# Patient Record
Sex: Female | Born: 1935 | Race: White | Hispanic: No | Marital: Married | Smoking: Never smoker
Health system: Southern US, Community
[De-identification: ages and names within clinical notes are randomized; demographics above are authoritative.]

---

## 1961-11-03 HISTORY — PX: BREAST BIOPSY: SHX20

## 2016-07-15 DIAGNOSIS — E7800 Pure hypercholesterolemia, unspecified: Secondary | ICD-10-CM | POA: Insufficient documentation

## 2016-09-30 ENCOUNTER — Other Ambulatory Visit: Payer: Self-pay | Admitting: Family Medicine

## 2016-09-30 DIAGNOSIS — Z1231 Encounter for screening mammogram for malignant neoplasm of breast: Secondary | ICD-10-CM

## 2016-11-06 ENCOUNTER — Other Ambulatory Visit: Payer: Self-pay | Admitting: Family Medicine

## 2016-11-06 ENCOUNTER — Ambulatory Visit
Admission: RE | Admit: 2016-11-06 | Discharge: 2016-11-06 | Disposition: A | Discharge status: Home / Self Care | Payer: Medicare Other | Source: Ambulatory Visit | Attending: Family Medicine | Admitting: Family Medicine

## 2016-11-06 DIAGNOSIS — Z1231 Encounter for screening mammogram for malignant neoplasm of breast: Secondary | ICD-10-CM

## 2016-11-14 ENCOUNTER — Other Ambulatory Visit: Payer: Self-pay | Admitting: *Deleted

## 2016-11-14 ENCOUNTER — Inpatient Hospital Stay
Admission: RE | Admit: 2016-11-14 | Discharge: 2016-11-14 | Disposition: A | Discharge status: Home / Self Care | Payer: Self-pay | Source: Ambulatory Visit | Attending: *Deleted | Admitting: *Deleted

## 2016-11-14 DIAGNOSIS — Z9289 Personal history of other medical treatment: Secondary | ICD-10-CM

## 2017-01-15 DIAGNOSIS — L719 Rosacea, unspecified: Secondary | ICD-10-CM | POA: Insufficient documentation

## 2017-01-15 DIAGNOSIS — E559 Vitamin D deficiency, unspecified: Secondary | ICD-10-CM | POA: Insufficient documentation

## 2017-10-09 ENCOUNTER — Other Ambulatory Visit: Payer: Self-pay | Admitting: Family Medicine

## 2017-10-09 DIAGNOSIS — Z1231 Encounter for screening mammogram for malignant neoplasm of breast: Secondary | ICD-10-CM

## 2017-11-11 ENCOUNTER — Ambulatory Visit
Admission: RE | Admit: 2017-11-11 | Discharge: 2017-11-11 | Disposition: A | Discharge status: Home / Self Care | Payer: Medicare Other | Source: Ambulatory Visit | Attending: Family Medicine | Admitting: Family Medicine

## 2017-11-11 DIAGNOSIS — Z1231 Encounter for screening mammogram for malignant neoplasm of breast: Secondary | ICD-10-CM | POA: Diagnosis present

## 2018-02-10 IMAGING — MG MM DIGITAL SCREENING BILAT W/ TOMO W/ CAD
8 of 12 series · 8 of 28 positions shown · non-contrast
Comparison: Previous exam(s).

CLINICAL DATA: Screening.

EXAM:
2D DIGITAL SCREENING BILATERAL MAMMOGRAM WITH CAD AND ADJUNCT TOMO

[L CC]
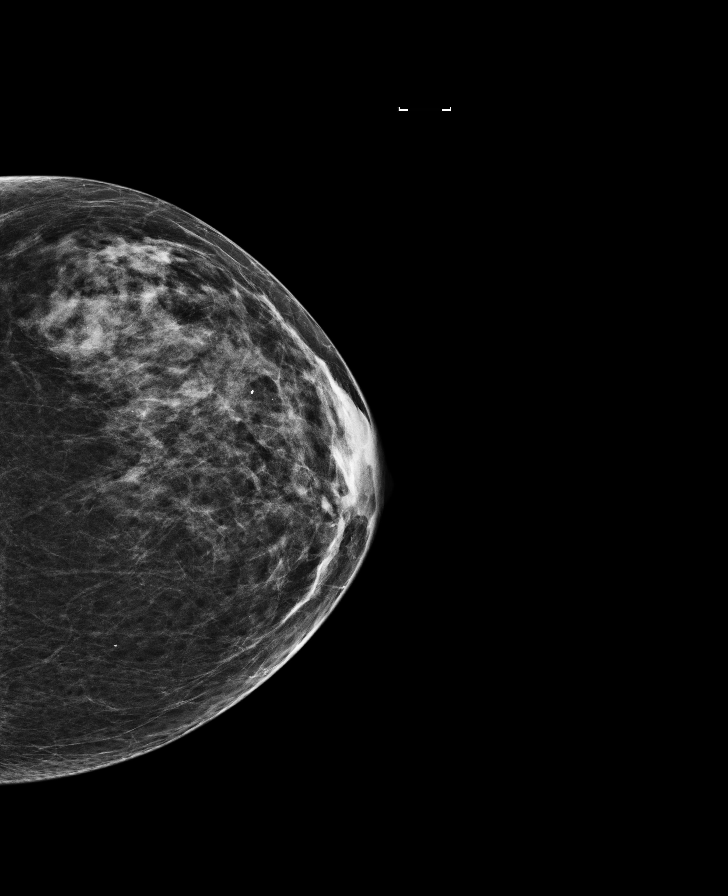

[L MLO]
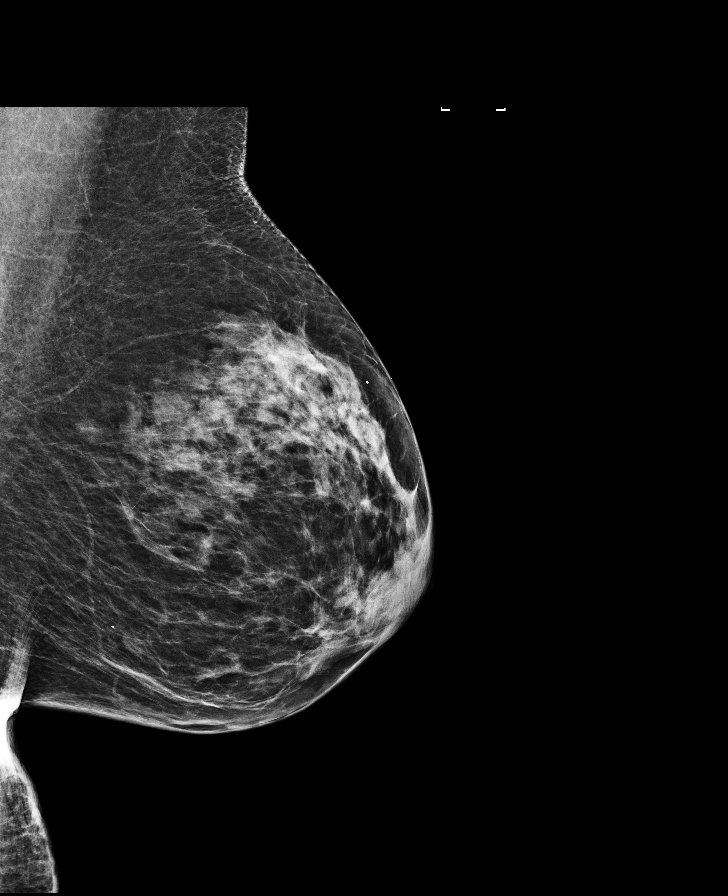

[L CC synth-2D]
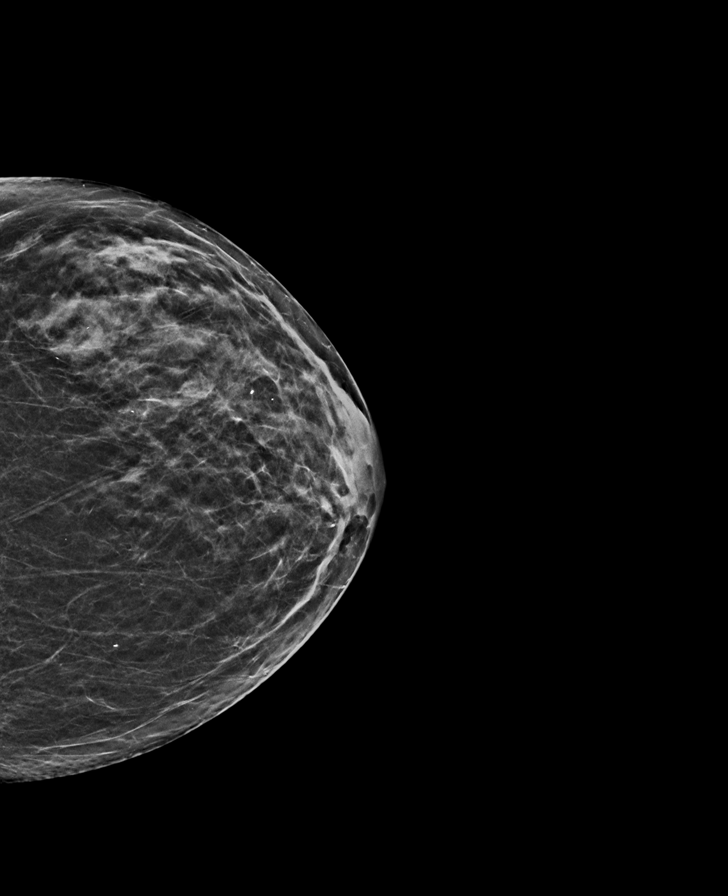

[R MLO synth-2D]
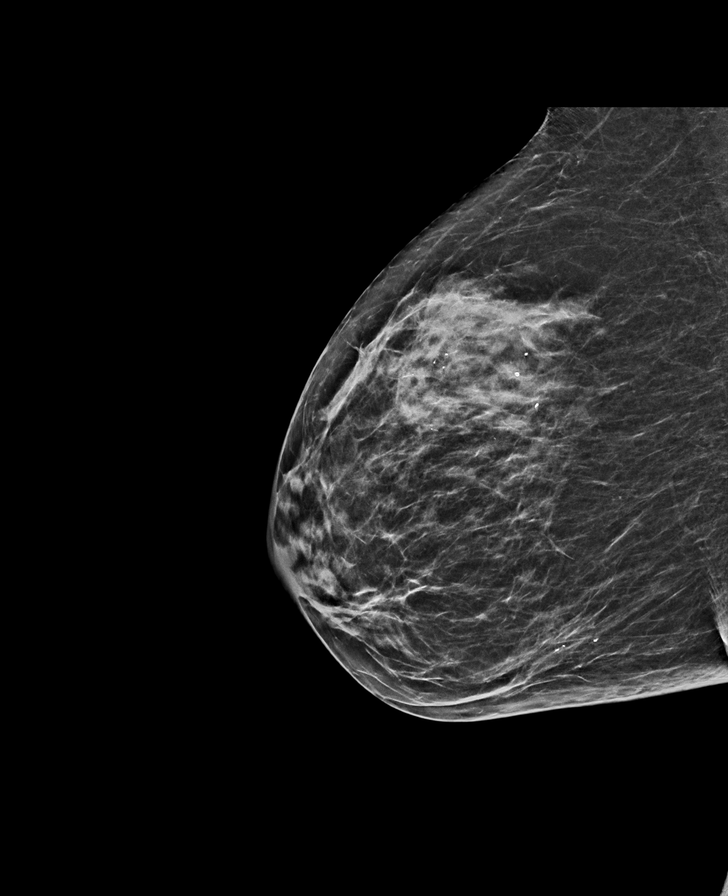

[R CC synth-2D]
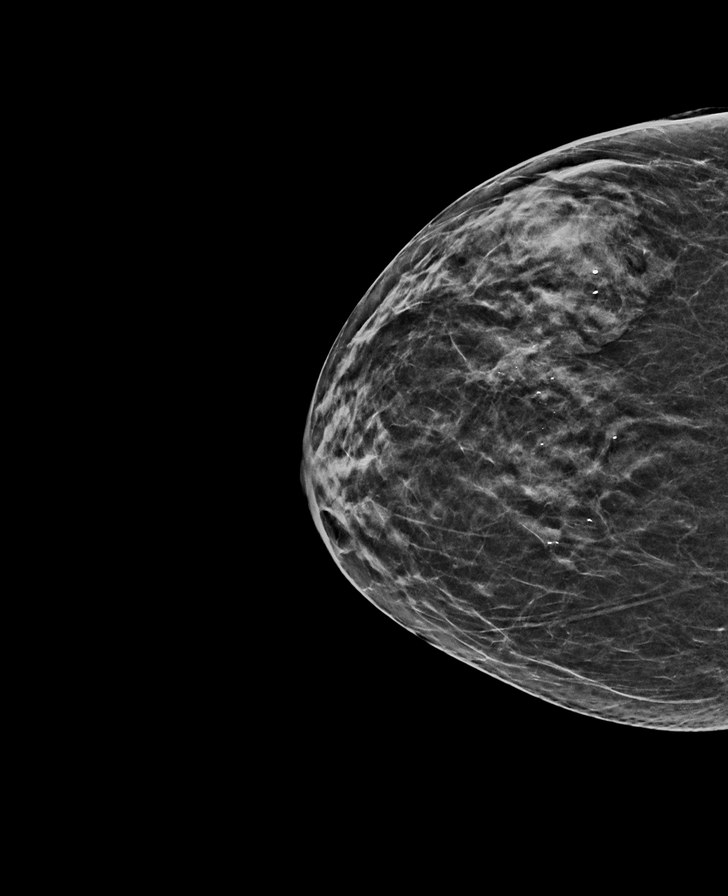

[L MLO synth-2D]
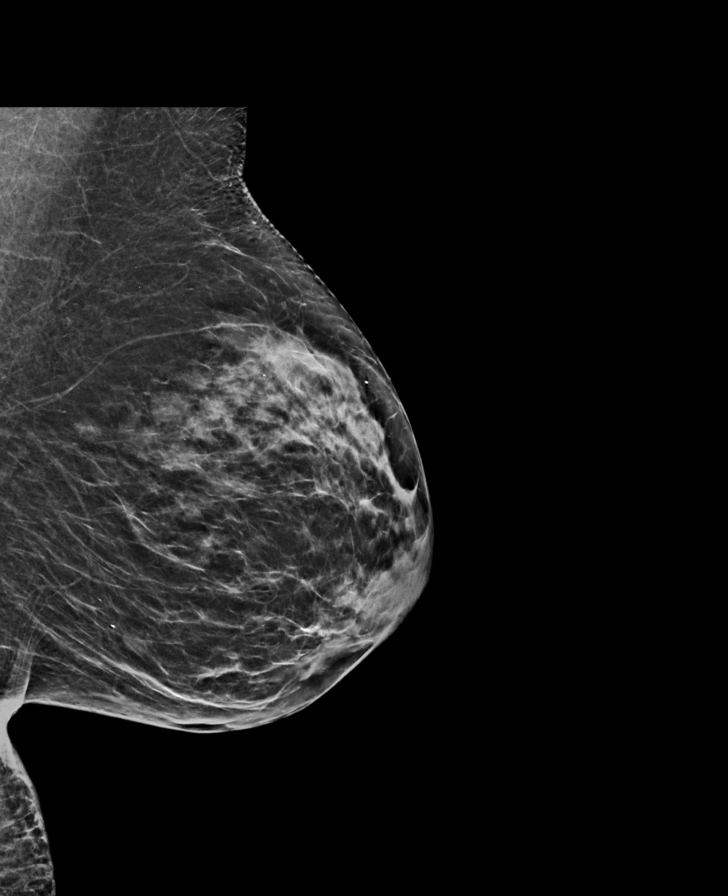

[R CC]
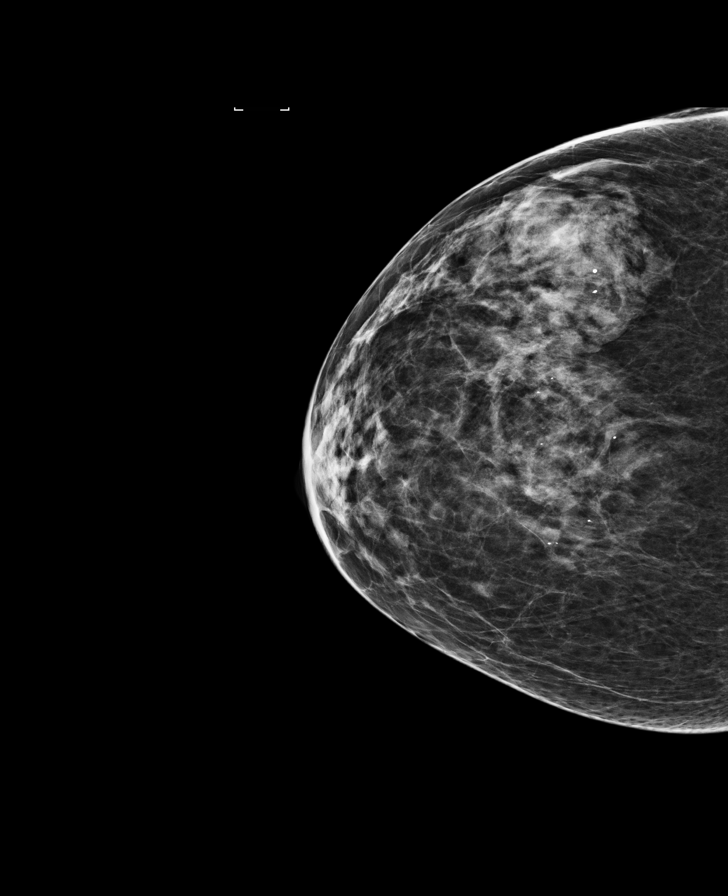

[R MLO]
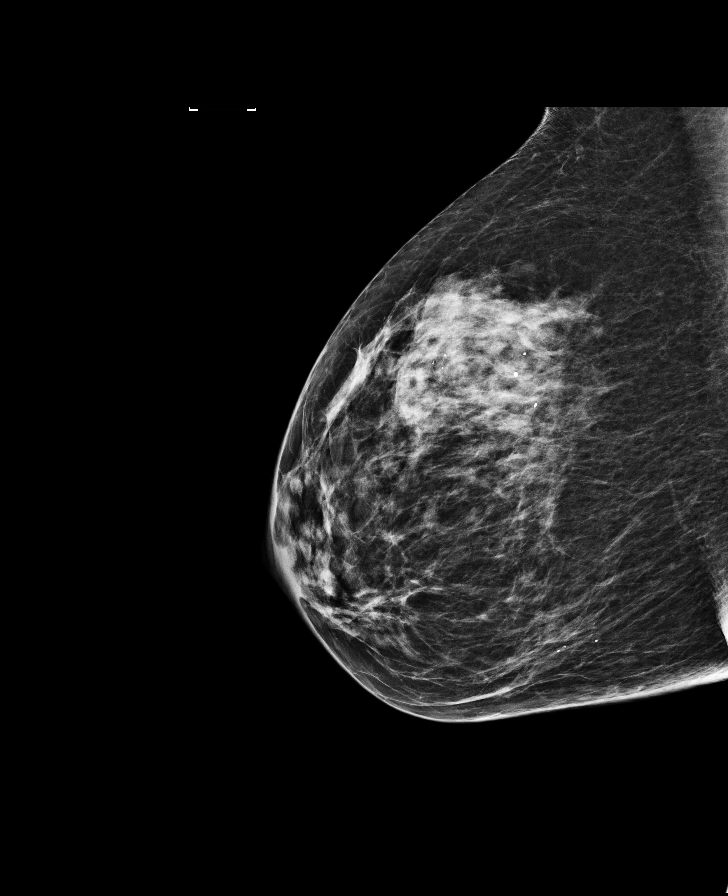

[8 of 28 positions shown; findings below may reference images not displayed]

ACR Breast Density Category c: The breast tissue is heterogeneously
dense, which may obscure small masses.
FINDINGS: There are no findings suspicious for malignancy. Images were
processed with CAD.
IMPRESSION: No mammographic evidence of malignancy. A result letter of this
screening mammogram will be mailed directly to the patient.

RECOMMENDATION:
Screening mammogram in one year. (Code:[id number])

BI-RADS CATEGORY  1: Negative.

## 2018-10-08 ENCOUNTER — Other Ambulatory Visit: Payer: Self-pay | Admitting: Family Medicine

## 2018-10-08 DIAGNOSIS — Z1231 Encounter for screening mammogram for malignant neoplasm of breast: Secondary | ICD-10-CM

## 2018-11-17 ENCOUNTER — Ambulatory Visit
Admission: RE | Admit: 2018-11-17 | Discharge: 2018-11-17 | Disposition: A | Discharge status: Home / Self Care | Payer: Medicare Other | Source: Ambulatory Visit | Attending: Family Medicine | Admitting: Family Medicine

## 2018-11-17 DIAGNOSIS — Z1231 Encounter for screening mammogram for malignant neoplasm of breast: Secondary | ICD-10-CM | POA: Insufficient documentation

## 2019-07-29 DIAGNOSIS — L659 Nonscarring hair loss, unspecified: Secondary | ICD-10-CM | POA: Insufficient documentation

## 2019-10-17 ENCOUNTER — Other Ambulatory Visit: Payer: Self-pay | Admitting: Family Medicine

## 2019-10-17 DIAGNOSIS — Z1231 Encounter for screening mammogram for malignant neoplasm of breast: Secondary | ICD-10-CM

## 2019-11-25 ENCOUNTER — Ambulatory Visit
Admission: RE | Admit: 2019-11-25 | Discharge: 2019-11-25 | Disposition: A | Discharge status: Home / Self Care | Payer: Medicare PPO | Source: Ambulatory Visit | Attending: Family Medicine | Admitting: Family Medicine

## 2019-11-25 DIAGNOSIS — Z1231 Encounter for screening mammogram for malignant neoplasm of breast: Secondary | ICD-10-CM | POA: Insufficient documentation

## 2020-11-12 ENCOUNTER — Other Ambulatory Visit: Payer: Self-pay | Admitting: Family Medicine

## 2020-11-12 DIAGNOSIS — Z1231 Encounter for screening mammogram for malignant neoplasm of breast: Secondary | ICD-10-CM

## 2020-12-07 ENCOUNTER — Other Ambulatory Visit: Payer: Self-pay

## 2020-12-07 ENCOUNTER — Ambulatory Visit
Admission: RE | Admit: 2020-12-07 | Discharge: 2020-12-07 | Disposition: A | Discharge status: Home / Self Care | Payer: Medicare PPO | Source: Ambulatory Visit | Attending: Family Medicine | Admitting: Family Medicine

## 2020-12-07 DIAGNOSIS — Z1231 Encounter for screening mammogram for malignant neoplasm of breast: Secondary | ICD-10-CM | POA: Diagnosis present

## 2021-11-19 ENCOUNTER — Other Ambulatory Visit: Payer: Self-pay | Admitting: Family Medicine

## 2021-11-19 DIAGNOSIS — Z1231 Encounter for screening mammogram for malignant neoplasm of breast: Secondary | ICD-10-CM

## 2021-12-27 ENCOUNTER — Ambulatory Visit
Admission: RE | Admit: 2021-12-27 | Discharge: 2021-12-27 | Disposition: A | Discharge status: Home / Self Care | Payer: Medicare PPO | Source: Ambulatory Visit | Attending: Family Medicine | Admitting: Family Medicine

## 2021-12-27 ENCOUNTER — Other Ambulatory Visit: Payer: Self-pay

## 2021-12-27 DIAGNOSIS — Z1231 Encounter for screening mammogram for malignant neoplasm of breast: Secondary | ICD-10-CM | POA: Diagnosis present

## 2022-09-02 DIAGNOSIS — R7303 Prediabetes: Secondary | ICD-10-CM | POA: Insufficient documentation

## 2022-11-24 ENCOUNTER — Encounter: Payer: Self-pay | Admitting: Family Medicine

## 2022-11-24 DIAGNOSIS — Z1231 Encounter for screening mammogram for malignant neoplasm of breast: Secondary | ICD-10-CM

## 2022-11-25 ENCOUNTER — Other Ambulatory Visit: Payer: Self-pay | Admitting: Family Medicine

## 2022-11-25 DIAGNOSIS — Z1231 Encounter for screening mammogram for malignant neoplasm of breast: Secondary | ICD-10-CM

## 2023-01-21 ENCOUNTER — Ambulatory Visit
Admission: RE | Admit: 2023-01-21 | Discharge: 2023-01-21 | Disposition: A | Discharge status: Home / Self Care | Payer: Medicare PPO | Source: Ambulatory Visit | Attending: Family Medicine | Admitting: Family Medicine

## 2023-01-21 DIAGNOSIS — Z1231 Encounter for screening mammogram for malignant neoplasm of breast: Secondary | ICD-10-CM | POA: Insufficient documentation

## 2023-05-27 ENCOUNTER — Ambulatory Visit: Payer: Medicare PPO | Admitting: Student

## 2023-05-27 ENCOUNTER — Encounter: Payer: Self-pay | Admitting: Student

## 2023-05-27 VITALS — BP 134/76 | HR 76 | Temp 98.2°F | Ht 60.0 in | Wt 115.0 lb

## 2023-05-27 DIAGNOSIS — L0100 Impetigo, unspecified: Secondary | ICD-10-CM | POA: Diagnosis not present

## 2023-05-27 DIAGNOSIS — B880 Other acariasis: Secondary | ICD-10-CM

## 2023-05-27 DIAGNOSIS — M810 Age-related osteoporosis without current pathological fracture: Secondary | ICD-10-CM

## 2023-05-27 MED ORDER — TRIAMCINOLONE ACETONIDE 0.1 % EX CREA
1.0000 | TOPICAL_CREAM | Freq: Two times a day (BID) | CUTANEOUS | 0 refills | Status: AC
Start: 2023-05-27 — End: ?

## 2023-05-27 MED ORDER — RISEDRONATE SODIUM 150 MG PO TABS
150.0000 mg | ORAL_TABLET | ORAL | 3 refills | Status: DC
Start: 2023-05-27 — End: 2024-01-12

## 2023-05-27 MED ORDER — MUPIROCIN 2 % EX OINT
1.0000 | TOPICAL_OINTMENT | Freq: Two times a day (BID) | CUTANEOUS | 0 refills | Status: DC
Start: 2023-05-27 — End: 2024-09-27

## 2023-05-27 NOTE — Patient Instructions (Addendum)
For the areas that are itching on your legs, please apply a small amount of Triamcinolone (Kenalog) cream and Mupirocin (Bactroban) ointment to all of the red areas on your legs.   Please purchase OFF Bug spray to wear before you go on walks, cross through a treed space.

## 2023-05-27 NOTE — Progress Notes (Signed)
[facility] clinic [facility].   Provider: Dr. Earnestine Mealing  Code Status: Full Code Goals of Care:     05/27/2023    2:49 PM  Advanced Directives  Does Patient Have a Medical Advance Directive? Yes  Type of Advance Directive Healthcare Power of Attorney  Does patient want to make changes to medical advance directive? No - Patient declined  Copy of Healthcare Power of Attorney in Chart? No - copy requested     Chief Complaint  Patient presents with   Acute Visit    Rash    HPI: Patient is a 89 y.o. female seen today for an acute visit for Rash  She has a new rash. Unclear  if it's from a bit. She noticed the first bite on Sunday. And Now it's on multiple locations. She had little yellow pustules on top of them. She took a sewing needle and dipped it in alcohol and she picked off the pus. She spray antiseptic on it as well.    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   She wants to stop taking ibandronate-- she has been taking it for 4 months. Doesn't like it and wants it changed.   History reviewed. No pertinent past medical history.  Past Surgical History:  Procedure Laterality Date   BREAST BIOPSY Right 1963   negative    Allergies  Allergen Reactions   Bee Pollen    Molds & Smuts     Outpatient Encounter Medications as of 05/27/2023  Medication Sig   acetaminophen (TYLENOL) 500 MG tablet Take 500 mg by mouth every 6 (six) hours as needed.   Biotin 5000 MCG CAPS Take 1 capsule by mouth daily.   cetirizine (ZYRTEC) 10 MG tablet Take 10 mg by mouth daily.   Cholecalciferol (VITAMIN D3) 50 MCG (2000 UT) capsule Take 2,000 Units by mouth daily.   ezetimibe (ZETIA) 10 MG tablet Take 10 mg by mouth daily.   metroNIDAZOLE (METROCREAM) 0.75 % cream Apply 1 Application topically 2 (two) times daily.   mupirocin ointment (BACTROBAN) 2 % Apply 1 Application topically 2 (two) times daily.   risedronate (ACTONEL) 150 MG tablet Take 1 tablet (150 mg total) by  mouth every 30 (thirty) days. with water on empty stomach, nothing by mouth or lie down for next 30 minutes.   triamcinolone cream (KENALOG) 0.1 % Apply 1 Application topically 2 (two) times daily.   [DISCONTINUED] ibandronate (BONIVA) 150 MG tablet Take 150 mg by mouth every 30 (thirty) days. Take in the morning with a full glass of water, on an empty stomach, and do not take anything else by mouth or lie down for the next 30 min.   No facility-administered encounter medications on file as of 05/27/2023.    Review of Systems:  Review of Systems  Health Maintenance  Topic Date Due   Medicare Annual Wellness (AWV)  Never done   Zoster Vaccines- Shingrix (1 of 2) 09/20/1951   DEXA SCAN  Never done   DTaP/Tdap/Td (2 - Td or Tdap) 07/04/2020   COVID-19 Vaccine (9 - 2023-24 season) 04/07/2023   INFLUENZA VACCINE  06/04/2023   Pneumonia Vaccine 57+ Years old  Completed   HPV VACCINES  Aged Out    Physical Exam: Vitals:   05/27/23 1440  BP: 134/76  Pulse: 76  Temp: 98.2 F (36.8 C)  SpO2: 96%  Weight: 115 lb (52.2 kg)  Height: 5' (1.524 m)   Body mass index is 22.46 kg/m. Physical  Exam Constitutional:      Appearance: Normal appearance.  Skin:    Comments: Numerous erythematous pustules of the ankles and legs.   Neurological:     Mental Status: She is alert and oriented to person, place, and time.  Psychiatric:        Mood and Affect: Mood normal.     Labs reviewed: Basic Metabolic Panel: No results for input(s): "NA", "K", "CL", "CO2", "GLUCOSE", "BUN", "CREATININE", "CALCIUM", "MG", "PHOS", "TSH" in the last 8760 hours. Liver Function Tests: No results for input(s): "AST", "ALT", "ALKPHOS", "BILITOT", "PROT", "ALBUMIN" in the last 8760 hours. No results for input(s): "LIPASE", "AMYLASE" in the last 8760 hours. No results for input(s): "AMMONIA" in the last 8760 hours. CBC: No results for input(s): "WBC", "NEUTROABS", "HGB", "HCT", "MCV", "PLT" in the last 8760  hours. Lipid Panel: No results for input(s): "CHOL", "HDL", "LDLCALC", "TRIG", "CHOLHDL", "LDLDIRECT" in the last 8760 hours. No results found for: "HGBA1C"  Procedures since last visit: No results found.  Assessment/Plan Age-related osteoporosis without current pathological fracture - Plan: risedronate (ACTONEL) 150 MG tablet  Chiggers - Plan: mupirocin ointment (BACTROBAN) 2 %, triamcinolone cream (KENALOG) 0.1 %  Impetigo Patient with chigger infestation. Encouraged bug spray and washing clothes with hot water. Cream for itching and overlying impetigo. Antipruitic and antiseptic prescribed.   Labs/tests ordered:  * No order type specified * Next appt:  Visit date not found   I spent greater than 30 minutes for the care of this patient in face to face time, chart review, clinical documentation, patient education.

## 2023-06-05 ENCOUNTER — Ambulatory Visit: Payer: Medicare PPO | Admitting: Student

## 2023-06-05 ENCOUNTER — Encounter: Payer: Self-pay | Admitting: Student

## 2023-06-05 VITALS — BP 124/62 | HR 69 | Temp 97.8°F | Ht 60.0 in | Wt 116.0 lb

## 2023-06-05 DIAGNOSIS — Z23 Encounter for immunization: Secondary | ICD-10-CM

## 2023-06-05 DIAGNOSIS — M810 Age-related osteoporosis without current pathological fracture: Secondary | ICD-10-CM | POA: Diagnosis not present

## 2023-06-05 DIAGNOSIS — E559 Vitamin D deficiency, unspecified: Secondary | ICD-10-CM

## 2023-06-05 DIAGNOSIS — E7800 Pure hypercholesterolemia, unspecified: Secondary | ICD-10-CM

## 2023-06-05 DIAGNOSIS — R7303 Prediabetes: Secondary | ICD-10-CM

## 2023-06-05 NOTE — Progress Notes (Signed)
[facility] clinic [facility].   Provider: Dr. Earnestine Mealing  Code Status: Full Code Goals of Care:     06/05/2023    2:15 PM  Advanced Directives  Does Patient Have a Medical Advance Directive? Yes  Type of Advance Directive Healthcare Power of Attorney  Does patient want to make changes to medical advance directive? No - Patient declined  Copy of Healthcare Power of Attorney in Chart? No - copy requested     Chief Complaint  Patient presents with   Establish Care    NP to establish Care.    Quality Metric Gaps    To discuss need for Zoster, Tdap, Covid, Dexascan and AWV   HPI: Patient is a 89 y.o. female seen today To establish.   The Chigger bites "still look stupid" but they are healing.   Medical History/ Medications: No concerns f/u A1c q36mo next due 08/2023 Osteoporosis - will start Alopecia - she sees dermatology.  Prediabetes 5.9 02/2023.  Rosacea - metro cream BID.   Mobility - she had a fall in a [employer] parking lot. Right hand x-rayed. She is more careful. Curb was the issue on that one. She drives. No assistive devices. Enjoys walks when the weather is nice.   Mentation - mood and memory Husband passed. She is glad he isn't suffering anymore. Her husband just passed away. She is eating well and sleeping well. Missing going to lunch with him.   Matters - she wants to avoid diabetes.She often follows the sugar busters diet.   Her daughter is in town. She has to think about what she wants to do. She has 24 units on her court and people have invited her to lunch. She is trying to figure out what she would like to do. Retirement devotions? They have donated everything to [employer]. 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 She moved to [place] and she went to [employer] in [place]. She worked with pre-schoolers. Two daughters - one in [place] and the other is in [place]. She has one grandson Lawrence in [place].    Eyes and ears checked semiannually.   History reviewed. No pertinent past medical history.  Past Surgical History:  Procedure Laterality Date   BREAST BIOPSY Right 1963   negative    Allergies  Allergen Reactions   Bee Pollen    Molds & Smuts     Outpatient Encounter Medications as of 06/05/2023  Medication Sig   acetaminophen (TYLENOL) 500 MG tablet Take 500 mg by mouth every 6 (six) hours as needed.   Biotin 5000 MCG CAPS Take 1 capsule by mouth daily.   cetirizine (ZYRTEC) 10 MG tablet Take 10 mg by mouth daily.   Cholecalciferol (VITAMIN D3) 50 MCG (2000 UT) capsule Take 2,000 Units by mouth daily.   ezetimibe (ZETIA) 10 MG tablet Take 10 mg by mouth daily.   metroNIDAZOLE (METROCREAM) 0.75 % cream Apply 1 Application topically 2 (two) times daily.   mupirocin ointment (BACTROBAN) 2 % Apply 1 Application topically 2 (two) times daily.   risedronate (ACTONEL) 150 MG tablet Take 1 tablet (150 mg total) by mouth every 30 (thirty) days. with water on empty stomach, nothing by mouth or lie down for next 30 minutes.   triamcinolone cream (KENALOG) 0.1 % Apply 1 Application topically 2 (two) times daily.   No facility-administered encounter medications on file as of 06/05/2023.    Review of Systems:  Review of  Systems  Health Maintenance  Topic Date Due   Medicare Annual Wellness (AWV)  Never done   Zoster Vaccines- Shingrix (1 of 2) 09/20/1951   DTaP/Tdap/Td (2 - Td or Tdap) 07/04/2020   COVID-19 Vaccine (9 - 2023-24 season) 04/07/2023   INFLUENZA VACCINE  06/04/2023   Pneumonia Vaccine 55+ Years old  Completed   DEXA SCAN  Completed   HPV VACCINES  Aged Out    Physical Exam: Vitals:   06/05/23 1412  BP: 124/62  Pulse: 69  Temp: 97.8 F (36.6 C)  SpO2: 96%  Weight: 116 lb (52.6 kg)  Height: 5' (1.524 m)   Body mass index is 22.65 kg/m. Physical Exam Vitals reviewed.  Constitutional:      Appearance: Normal appearance. She is normal weight.  Cardiovascular:      Rate and Rhythm: Normal rate and regular rhythm.     Pulses: Normal pulses.  Pulmonary:     Effort: Pulmonary effort is normal.  Abdominal:     General: Abdomen is flat.     Palpations: Abdomen is soft.  Skin:    General: Skin is warm and dry.  Neurological:     Mental Status: She is alert and oriented to person, place, and time.     Labs reviewed: Basic Metabolic Panel: No results for input(s): "NA", "K", "CL", "CO2", "GLUCOSE", "BUN", "CREATININE", "CALCIUM", "MG", "PHOS", "TSH" in the last 8760 hours. Liver Function Tests: No results for input(s): "AST", "ALT", "ALKPHOS", "BILITOT", "PROT", "ALBUMIN" in the last 8760 hours. No results for input(s): "LIPASE", "AMYLASE" in the last 8760 hours. No results for input(s): "AMMONIA" in the last 8760 hours. CBC: No results for input(s): "WBC", "NEUTROABS", "HGB", "HCT", "MCV", "PLT" in the last 8760 hours. Lipid Panel: No results for input(s): "CHOL", "HDL", "LDLCALC", "TRIG", "CHOLHDL", "LDLDIRECT" in the last 8760 hours. No results found for: "HGBA1C"  Procedures since last visit: No results found.  Assessment/Plan Need for zoster vaccination - Plan: Zoster Recombinant (Shingrix )  Need for tetanus booster - Plan: DTaP HepB IPV combined vaccine IM  Prediabetes - Plan: Hemoglobin A1c  Age-related osteoporosis without current pathological fracture  Vitamin D deficiency - Plan: VITAMIN D 25 Hydroxy (Vit-D Deficiency, Fractures)  Pure hypercholesterolemia - Plan: CBC With Differential/Platelet, Lipid Panel, Complete Metabolic Panel with eGFR, CANCELED: Basic Metabolic Panel with eGFR Patient has been stable with most recent labs in normal range. F/u 2 months for labs as well as follow up on medication change to risedronate and her mood.    Labs/tests ordered:  * No order type specified * Next appt:  Visit date not found   I spent greater than 60 minutes for the care of this patient in face to face time, chart review,  clinical documentation, patient education.

## 2023-06-05 NOTE — Patient Instructions (Addendum)
Please go to [facility] ot get your tetanus and shingles vaccination.   Please come to clinic on October 3 at 7:30 AM to have your labs collected. I will see you the following Monday.   Please bring your health care power of attorney paperwork to your next appointment or before.   Pain - acetaminophen (TYLENOL) 500 MG tablet Hair Vitamin - Biotin 5000 MCG CAPS  Vitamin D Deficiency/Osteoporosis (Bone Health) - Cholecalciferol (VITAMIN D3) 50 MCG (2000 UT) capsule Osteoporosis (Bone Health) risedronate (ACTONEL) 150 MG tablet  Cholesterol - ezetimibe (ZETIA) 10 MG tablet Rosacea (skin on nose) - metroNIDAZOLE (METROCREAM) 0.75 % cream Chiggers - mupirocin ointment (BACTROBAN) 2 % triamcinolone cream (KENALOG) 0.1 % Allergy Medication - Cetirizine 10 mg (Zyrtec)

## 2023-06-08 ENCOUNTER — Telehealth: Payer: Self-pay | Admitting: *Deleted

## 2023-06-08 DIAGNOSIS — Z23 Encounter for immunization: Secondary | ICD-10-CM

## 2023-06-08 MED ORDER — TETANUS-DIPHTH-ACELL PERTUSSIS 5-2-15.5 LF-MCG/0.5 IM SUSP
0.5000 mL | Freq: Once | INTRAMUSCULAR | 0 refills | Status: AC
Start: 2023-06-08 — End: 2023-06-08

## 2023-06-08 NOTE — Telephone Encounter (Signed)
Patient called and stated that Dr. Sydnee Cabal recommended patient to get a Tdap booster. Pharmacy will not give to patient without a Rx.   Pended Rx and sent to Dr. Sydnee Cabal for approval.

## 2023-06-08 NOTE — Telephone Encounter (Signed)
Rx sent to [facility]

## 2023-06-09 NOTE — Telephone Encounter (Signed)
Patient notified and agreed.  

## 2023-08-07 LAB — LIPID PANEL
Cholesterol: 143 mg/dL (ref <200)
HDL: 56 mg/dL (ref >=50)
LDL Cholesterol (Calc): 72 mg/dL
Non-HDL Cholesterol (Calc): 87 mg/dL (ref <130)
Total CHOL/HDL Ratio: 2.6 (calc) (ref <5.0)
Triglycerides: 69 mg/dL (ref <150)

## 2023-08-07 LAB — CBC WITH DIFFERENTIAL/PLATELET
Absolute Monocytes: 548 {cells}/uL (ref 200–950)
Basophils Absolute: 38 {cells}/uL (ref 0–200)
Basophils Relative: 0.6 %
Eosinophils Absolute: 347 {cells}/uL (ref 15–500)
Eosinophils Relative: 5.5 %
HCT: 40 % (ref 35.0–45.0)
Hemoglobin: 13.2 g/dL (ref 11.7–15.5)
Lymphs Abs: 2413 {cells}/uL (ref 850–3900)
MCH: 34.5 pg — ABNORMAL HIGH (ref 27.0–33.0)
MCHC: 33 g/dL (ref 32.0–36.0)
MCV: 104.4 fL — ABNORMAL HIGH (ref 80.0–100.0)
MPV: 10.4 fL (ref 7.5–12.5)
Monocytes Relative: 8.7 %
Neutro Abs: 2955 {cells}/uL (ref 1500–7800)
Neutrophils Relative %: 46.9 %
Platelets: 242 10*3/uL (ref 140–400)
RBC: 3.83 10*6/uL (ref 3.80–5.10)
RDW: 12.4 % (ref 11.0–15.0)
Total Lymphocyte: 38.3 %
WBC: 6.3 10*3/uL (ref 3.8–10.8)

## 2023-08-07 LAB — COMPLETE METABOLIC PANEL WITH GFR
AG Ratio: 1.7 (calc) (ref 1.0–2.5)
ALT: 13 U/L (ref 6–29)
AST: 22 U/L (ref 10–35)
Albumin: 4.2 g/dL (ref 3.6–5.1)
Alkaline phosphatase (APISO): 62 U/L (ref 37–153)
BUN: 17 mg/dL (ref 7–25)
CO2: 29 mmol/L (ref 20–32)
Calcium: 8.7 mg/dL (ref 8.6–10.4)
Chloride: 106 mmol/L (ref 98–110)
Creat: 0.82 mg/dL (ref 0.60–0.95)
Globulin: 2.5 g/dL (ref 1.9–3.7)
Glucose, Bld: 65 mg/dL (ref 65–99)
Potassium: 3.9 mmol/L (ref 3.5–5.3)
Sodium: 143 mmol/L (ref 135–146)
Total Bilirubin: 0.5 mg/dL (ref 0.2–1.2)
Total Protein: 6.7 g/dL (ref 6.1–8.1)
eGFR: 69 mL/min/{1.73_m2} (ref >=60)

## 2023-08-07 LAB — HEMOGLOBIN A1C
Hgb A1c MFr Bld: 5.9 %{Hb} — ABNORMAL HIGH (ref <5.7)
Mean Plasma Glucose: 123 mg/dL
eAG (mmol/L): 6.8 mmol/L

## 2023-08-07 LAB — VITAMIN D 25 HYDROXY (VIT D DEFICIENCY, FRACTURES): Vit D, 25-Hydroxy: 46 ng/mL (ref 30–100)

## 2023-08-10 ENCOUNTER — Ambulatory Visit: Payer: Medicare PPO | Admitting: Student

## 2023-08-12 ENCOUNTER — Encounter: Payer: Self-pay | Admitting: Student

## 2023-08-12 ENCOUNTER — Ambulatory Visit: Payer: Medicare PPO | Admitting: Student

## 2023-08-12 VITALS — BP 118/64 | HR 67 | Temp 98.0°F | Ht 60.0 in | Wt 113.0 lb

## 2023-08-12 DIAGNOSIS — E7800 Pure hypercholesterolemia, unspecified: Secondary | ICD-10-CM

## 2023-08-12 DIAGNOSIS — M810 Age-related osteoporosis without current pathological fracture: Secondary | ICD-10-CM | POA: Diagnosis not present

## 2023-08-12 DIAGNOSIS — F4321 Adjustment disorder with depressed mood: Secondary | ICD-10-CM

## 2023-08-12 DIAGNOSIS — R7303 Prediabetes: Secondary | ICD-10-CM

## 2023-08-12 DIAGNOSIS — E559 Vitamin D deficiency, unspecified: Secondary | ICD-10-CM | POA: Diagnosis not present

## 2023-08-12 NOTE — Progress Notes (Unsigned)
Location:  [facility] clinic [facility].   Provider: Dr. Earnestine Mealing  Code Status: Full Code Goals of Care:     08/12/2023    1:07 PM  Advanced Directives  Does Patient Have a Medical Advance Directive? Yes  Type of Advance Directive Healthcare Power of Attorney  Does patient want to make changes to medical advance directive? No - Patient declined  Copy of Healthcare Power of Attorney in Chart? Yes - validated most recent copy scanned in chart (See row information)     Chief Complaint  Patient presents with  . Medical Management of Chronic Issues    Medical Management of Chronic Issues. 2 Month follow up with labs.     HPI: Patient is a 89 y.o. female seen today for medical management of chronic diseases.    Discussed in detail patients labs 12 minute discussion  Mood - has been up and down. She is going to have to get used to going to the hearth on campus without him being there meeting him for lunch. She has trouble going to the hearth without seeing her husband. He has been declining for 2 years.  She has been mourning the loss of her husband Jonny Ruiz who died in 06/03/23 of this year.  John's birthday was in 03-Jun-2023 as well as their anniversary and ultimately his passing.  03-Jun-2023 was a really hard month for them.  She continues to have sadness but things are getting better.  She is hopeful.  She started the new medication on a day that she remembers what day it is. She took the first bone pill on 9/18 no major side effects.   History reviewed. No pertinent past medical history.  Past Surgical History:  Procedure Laterality Date  . BREAST BIOPSY Right 1963   negative    Allergies  Allergen Reactions  . Bee Pollen   . Molds & Smuts     Outpatient Encounter Medications as of 08/12/2023  Medication Sig  . acetaminophen (TYLENOL) 500 MG tablet Take 500 mg by mouth every 6 (six) hours as needed.  . Biotin 5000 MCG CAPS Take 1 capsule by mouth daily.  . cetirizine (ZYRTEC) 10 MG  tablet Take 10 mg by mouth daily.  . Cholecalciferol (VITAMIN D3) 50 MCG (2000 UT) capsule Take 2,000 Units by mouth daily.  Marland Kitchen ezetimibe (ZETIA) 10 MG tablet Take 10 mg by mouth daily.  . metroNIDAZOLE (METROCREAM) 0.75 % cream Apply 1 Application topically 2 (two) times daily.  . mupirocin ointment (BACTROBAN) 2 % Apply 1 Application topically 2 (two) times daily.  . risedronate (ACTONEL) 150 MG tablet Take 1 tablet (150 mg total) by mouth every 30 (thirty) days. with water on empty stomach, nothing by mouth or lie down for next 30 minutes.  . triamcinolone cream (KENALOG) 0.1 % Apply 1 Application topically 2 (two) times daily.   No facility-administered encounter medications on file as of 08/12/2023.    Review of Systems:  Review of Systems  Health Maintenance  Topic Date Due  . Medicare Annual Wellness (AWV)  Never done  . INFLUENZA VACCINE  06/04/2023  . COVID-19 Vaccine (10 - 2023-24 season) 09/25/2023  . DTaP/Tdap/Td (3 - Td or Tdap) 06/09/2033  . Pneumonia Vaccine 27+ Years old  Completed  . DEXA SCAN  Completed  . Zoster Vaccines- Shingrix  Completed  . HPV VACCINES  Aged Out    Physical Exam: Vitals:   08/12/23 1301  BP: 118/64  Pulse: 67  Temp: 98  F (36.7 C)  SpO2: 95%  Weight: 113 lb (51.3 kg)  Height: 5' (1.524 m)   Body mass index is 22.07 kg/m. Physical Exam Constitutional:      Appearance: Normal appearance.  Cardiovascular:     Rate and Rhythm: Normal rate and regular rhythm.     Pulses: Normal pulses.     Heart sounds: Normal heart sounds.  Pulmonary:     Effort: Pulmonary effort is normal.     Breath sounds: Normal breath sounds.  Skin:    General: Skin is warm and dry.  Neurological:     Mental Status: She is alert.    Labs reviewed: Basic Metabolic Panel: Recent Labs    08/06/23 0740  NA 143  K 3.9  CL 106  CO2 29  GLUCOSE 65  BUN 17  CREATININE 0.82  CALCIUM 8.7   Liver Function Tests: Recent Labs    08/06/23 0740  AST 22   ALT 13  BILITOT 0.5  PROT 6.7   No results for input(s): "LIPASE", "AMYLASE" in the last 8760 hours. No results for input(s): "AMMONIA" in the last 8760 hours. CBC: Recent Labs    08/06/23 0740  WBC 6.3  NEUTROABS 2,955  HGB 13.2  HCT 40.0  MCV 104.4*  PLT 242   Lipid Panel: Recent Labs    08/06/23 0740  CHOL 143  HDL 56  LDLCALC 72  TRIG 69  CHOLHDL 2.6   Lab Results  Component Value Date   HGBA1C 5.9 (H) 08/06/2023    Procedures since last visit: No results found.  Assessment/Plan Prediabetes  Pure hypercholesterolemia  Age-related osteoporosis without current pathological fracture  Vitamin D deficiency  Grief reaction   Labs/tests ordered:  * No order type specified * Next appt:  09/04/2023

## 2023-08-12 NOTE — Patient Instructions (Addendum)
Nice to see you today!  See you at your annual wellness visit!

## 2023-08-13 ENCOUNTER — Encounter: Payer: Self-pay | Admitting: Student

## 2023-09-04 ENCOUNTER — Encounter: Payer: Self-pay | Admitting: Student

## 2023-09-04 ENCOUNTER — Ambulatory Visit: Payer: Medicare PPO | Admitting: Student

## 2023-09-04 VITALS — BP 128/64 | HR 65 | Temp 98.1°F | Ht 60.0 in | Wt 113.0 lb

## 2023-09-04 DIAGNOSIS — Z0000 Encounter for general adult medical examination without abnormal findings: Secondary | ICD-10-CM | POA: Diagnosis not present

## 2023-09-04 NOTE — Patient Instructions (Signed)
Fat and Cholesterol Restricted Eating Plan Getting too much fat and cholesterol in your diet may cause health problems. Choosing the right foods helps keep your fat and cholesterol at normal levels. This can keep you from getting certain diseases. Your doctor may recommend an eating plan that includes: Total fat: ______% or less of total calories a day. This is ______g of fat a day. Saturated fat: ______% or less of total calories a day. This is ______g of saturated fat a day. Cholesterol: less than _________mg a day. Fiber: ______g a day. What are tips for following this plan? General tips Work with your doctor to lose weight if you need to. Avoid: Foods with added sugar. Fried foods. Foods with trans fat or partially hydrogenated oils. This includes some margarines and baked goods. If you drink alcohol: Limit how much you have to: 0-1 drink a day for women who are not pregnant. 0-2 drinks a day for men. Know how much alcohol is in a drink. In the [place] one drink equals one 12 oz bottle of beer (355 mL), one 5 oz glass of wine (148 mL), or one 1 oz glass of hard liquor (44 mL). Reading food labels Check food labels for: Trans fats. Partially hydrogenated oils. Saturated fat (g) in each serving. Cholesterol (mg) in each serving. Fiber (g) in each serving. Choose foods with healthy fats, such as: Monounsaturated fats and polyunsaturated fats. These include olive and canola oil, flaxseeds, walnuts, almonds, and seeds. Omega-3 fats. These are found in certain fish, flaxseed oil, and ground flaxseeds. Choose grain products that have whole grains. Look for the word "whole" as the first word in the ingredient list. Cooking Cook foods using low-fat methods. These include baking, boiling, grilling, and broiling. Eat more home-cooked foods. Eat at restaurants and buffets less often. Eat less fast food. Avoid cooking using saturated fats, such as butter, cream, palm oil, palm kernel oil, and  coconut oil. Meal planning  At meals, divide your plate into four equal parts: Fill one-half of your plate with vegetables, green salads, and fruit. Fill one-fourth of your plate with whole grains. Fill one-fourth of your plate with low-fat (lean) protein foods. Eat fish that is high in omega-3 fats at least two times a week. This includes mackerel, tuna, sardines, and salmon. Eat foods that are high in fiber, such as whole grains, beans, apples, pears, berries, broccoli, carrots, peas, and barley. What foods should I eat? Fruits All fresh, canned (in natural juice), or frozen fruits. Vegetables Fresh or frozen vegetables (raw, steamed, roasted, or grilled). Green salads. Grains Whole grains, such as whole wheat or whole grain breads, crackers, cereals, and pasta. Unsweetened oatmeal, bulgur, barley, quinoa, or brown rice. Corn or whole wheat flour tortillas. Meats and other protein foods Ground beef (85% or leaner), grass-fed beef, or beef trimmed of fat. Skinless chicken or [place]. Ground chicken or [place]. Pork trimmed of fat. All fish and seafood. Egg whites. Dried beans, peas, or lentils. Unsalted nuts or seeds. Unsalted canned beans. Nut butters without added sugar or oil. Dairy Low-fat or nonfat dairy products, such as skim or 1% milk, 2% or reduced-fat cheeses, low-fat and fat-free ricotta or cottage cheese, or plain low-fat and nonfat yogurt. Fats and oils Tub margarine without trans fats. Light or reduced-fat mayonnaise and salad dressings. Avocado. Olive, canola, sesame, or safflower oils. The items listed above may not be a complete list of foods and beverages you can eat. Contact a dietitian for more information. What foods  should I avoid? Fruits Canned fruit in heavy syrup. Fruit in cream or butter sauce. Fried fruit. Vegetables Vegetables cooked in cheese, cream, or butter sauce. Fried vegetables. Grains White bread. White pasta. White rice. Cornbread. Bagels, pastries,  and croissants. Crackers and snack foods that contain trans fat and hydrogenated oils. Meats and other protein foods Fatty cuts of meat. Ribs, chicken wings, bacon, sausage, bologna, salami, chitterlings, fatback, hot dogs, bratwurst, and packaged lunch meats. Liver and organ meats. Whole eggs and egg yolks. Chicken and [place] with skin. Fried meat. Dairy Whole or 2% milk, cream, half-and-half, and cream cheese. Whole milk cheeses. Whole-fat or sweetened yogurt. Full-fat cheeses. Nondairy creamers and whipped toppings. Processed cheese, cheese spreads, and cheese curds. Fats and oils Butter, stick margarine, lard, shortening, ghee, or bacon fat. Coconut, palm kernel, and palm oils. Beverages Alcohol. Sugar-sweetened drinks such as sodas, lemonade, and fruit drinks. Sweets and desserts Corn syrup, sugars, honey, and molasses. Candy. Jam and jelly. Syrup. Sweetened cereals. Cookies, pies, cakes, donuts, muffins, and ice cream. The items listed above may not be a complete list of foods and beverages you should avoid. Contact a dietitian for more information. Summary Choosing the right foods helps keep your fat and cholesterol at normal levels. This can keep you from getting certain diseases. At meals, fill one-half of your plate with vegetables, green salads, and fruits. Eat high fiber foods, like whole grains, beans, apples, pears, berries, carrots, peas, and barley. Limit added sugar, saturated fats, alcohol, and fried foods. This information is not intended to replace advice given to you by your health care provider. Make sure you discuss any questions you have with your health care provider. Document Revised: 03/01/2021 Document Reviewed: 03/01/2021 Elsevier Patient Education  2024 [employer].

## 2023-09-04 NOTE — Progress Notes (Signed)
Subjective:   Shelby Cruz is a 89 y.o. female who presents for Medicare Annual (Subsequent) preventive examination.  Visit Complete: In person  Patient Medicare AWV questionnaire was completed by the patient on done; I have confirmed that all information answered by patient is correct and no changes since this date.        Objective:    Today's Vitals   09/04/23 1420  BP: 128/64  Pulse: 65  Temp: 98.1 F (36.7 C)  SpO2: 98%  Weight: 113 lb (51.3 kg)  Height: 5' (1.524 m)   Body mass index is 22.07 kg/m.     09/04/2023    2:23 PM 08/12/2023    1:07 PM 06/05/2023    2:15 PM 05/27/2023    2:49 PM  Advanced Directives  Does Patient Have a Medical Advance Directive? Yes Yes Yes Yes  Type of Sales promotion account executive of [employer] Power of [employer] Power of Attorney  Does patient want to make changes to medical advance directive? No - Patient declined No - Patient declined No - Patient declined No - Patient declined  Copy of Healthcare Power of Attorney in Chart? Yes - validated most recent copy scanned in chart (See row information) Yes - validated most recent copy scanned in chart (See row information) No - copy requested No - copy requested    Current Medications (verified) Outpatient Encounter Medications as of 09/04/2023  Medication Sig   acetaminophen (TYLENOL) 500 MG tablet Take 500 mg by mouth every 6 (six) hours as needed.   Biotin 5000 MCG CAPS Take 1 capsule by mouth daily.   cetirizine (ZYRTEC) 10 MG tablet Take 10 mg by mouth daily.   Cholecalciferol (VITAMIN D3) 50 MCG (2000 UT) capsule Take 2,000 Units by mouth daily.   ezetimibe (ZETIA) 10 MG tablet Take 10 mg by mouth daily.   metroNIDAZOLE (METROCREAM) 0.75 % cream Apply 1 Application topically 2 (two) times daily.   mupirocin ointment (BACTROBAN) 2 % Apply 1 Application topically 2 (two) times daily.   risedronate (ACTONEL) 150 MG tablet Take 1  tablet (150 mg total) by mouth every 30 (thirty) days. with water on empty stomach, nothing by mouth or lie down for next 30 minutes.   triamcinolone cream (KENALOG) 0.1 % Apply 1 Application topically 2 (two) times daily.   No facility-administered encounter medications on file as of 09/04/2023.    Allergies (verified) Bee pollen and Molds & smuts   History: History reviewed. No pertinent past medical history. Past Surgical History:  Procedure Laterality Date   BREAST BIOPSY Right 1963   negative   Family History  Problem Relation Age of Onset   Breast cancer Neg Hx    Social History   Socioeconomic History   Marital status: Married    Spouse name: Not on file   Number of children: Not on file   Years of education: Not on file   Highest education level: Not on file  Occupational History   Not on file  Tobacco Use   Smoking status: Never   Smokeless tobacco: Never  Substance and Sexual Activity   Alcohol use: Never   Drug use: Never   Sexual activity: Not on file  Other Topics Concern   Not on file  Social History Narrative   Not on file   Social Determinants of Health   Financial Resource Strain: Patient Declined (02/09/2023)   Received from [facility]   Overall Financial  Resource Strain (CARDIA)    Difficulty of Paying Living Expenses: Patient declined  Food Insecurity: Patient Declined (02/09/2023)   Received from [facility]   Hunger Vital Sign    Worried About Running Out of Food in the Last Year: Patient declined    Ran Out of Food in the Last Year: Patient declined  Transportation Needs: Patient Declined (02/09/2023)   Received from [facility] - Transportation    In the past 12 months, has lack of transportation kept you from medical appointments or from getting medications?: Patient declined    Lack of Transportation (Non-Medical): Patient declined  Physical Activity: Not on file  Stress: Not  on file  Social Connections: Not on file    Tobacco Counseling Counseling given: Not Answered   Clinical Intake:  Pre-visit preparation completed: Yes  Pain : No/denies pain     BMI - recorded: 22.07 Nutritional Status: BMI of 19-24  Normal Nutritional Risks: None Diabetes: No  How often do you need to have someone help you when you read instructions, pamphlets, or other written materials from your doctor or pharmacy?: 1 - Never What is the last grade level you completed in school?: Master's Degree.  Interpreter Needed?: No      Activities of Daily Living     No data to display          Patient Care Team: Earnestine Mealing, MD as PCP - General (Family Medicine) Dingeldein, Viviann Spare, MD (Ophthalmology) Verneita Griffes, PZ-C (Dermatology)  Indicate any recent Medical Services you may have received from other than [facility] providers in the past year (date may be approximate).     Assessment:   This is a routine wellness examination for [place].  Hearing/Vision screen No results found.   Goals Addressed   None   Depression Screen    09/04/2023    2:23 PM 08/12/2023    1:07 PM 06/05/2023    2:39 PM  PHQ 2/9 Scores  PHQ - 2 Score 0 0 0  PHQ- 9 Score   1    Fall Risk    09/04/2023    2:23 PM 08/12/2023    1:07 PM 06/05/2023    6:37 PM  Fall Risk   Falls in the past year? 0 0 1  Number falls in past yr: 0 0 0  Injury with Fall? 0 0 0    MEDICARE RISK AT HOME:    TIMED UP AND GO:  Was the test performed?  Yes  Length of time to ambulate 10 feet: 13 sec Gait steady and fast without use of assistive device    Cognitive Function:        09/04/2023    2:23 PM  6CIT Screen  What Year? 0 points  What month? 0 points  What time? 0 points  Count back from 20 0 points  Months in reverse 0 points  Repeat phrase 4 points  Total Score 4 points    Immunizations Immunization History  Administered Date(s) Administered   Influenza-Unspecified  08/24/2022, 08/27/2023   Moderna Covid-19 Fall Seasonal Vaccine 59yrs & older 02/10/2023   Moderna Covid-19 Vaccine Bivalent Booster 70yrs & up 07/25/2021, 04/01/2022, 09/12/2022   Moderna Sars-Covid-2 Vaccination 11/18/2019, 12/16/2019, 09/18/2020, 03/21/2021, 07/31/2023   Pneumococcal Conjugate-13 01/12/2014   Pneumococcal Polysaccharide-23 10/03/1999, 11/05/1999   Tdap 07/04/2010, 06/10/2023   Zoster Recombinant(Shingrix) 02/24/2008, 06/10/2023   Zoster, Live 02/24/2008    TDAP status: Up to date  Flu Vaccine status:  Up to date  Pneumococcal vaccine status: Up to date  Covid-19 vaccine status: Completed vaccines  Qualifies for Shingles Vaccine? Yes   Zostavax completed No   Shingrix Completed?: Yes  Screening Tests Health Maintenance  Topic Date Due   COVID-19 Vaccine (10 - 2023-24 season) 09/25/2023   Medicare Annual Wellness (AWV)  09/03/2024   DTaP/Tdap/Td (3 - Td or Tdap) 06/09/2033   Pneumonia Vaccine 87+ Years old  Completed   INFLUENZA VACCINE  Completed   DEXA SCAN  Completed   Zoster Vaccines- Shingrix  Completed   HPV VACCINES  Aged Out    Health Maintenance  There are no preventive care reminders to display for this patient.  Colorectal cancer screening: No longer required.   Mammogram status: No longer required due to age. Patient prefers to continue them.   Bone Density status: Completed 02/17/2023. Results reflect: Bone density results: OSTEOPOROSIS. Repeat every 3-5 years.  Lung Cancer Screening: (Low Dose CT Chest recommended if Age 31-80 years, 20 pack-year currently smoking OR have quit w/in 15years.) does not qualify.   Lung Cancer Screening Referral: N/A  Additional Screening:  Hepatitis C Screening: does qualify; Completed unk, may need recollection  Vision Screening: Recommended annual ophthalmology exams for early detection of glaucoma and other disorders of the eye. Is the patient up to date with their annual eye exam?  Yes  Who is the  provider or what is the name of the office in which the patient attends annual eye exams? She goes to [facility].  If pt is not established with a provider, would they like to be referred to a provider to establish care?  NA .   Dental Screening: Recommended annual dental exams for proper oral hygiene  Diabetic Foot Exam: N/A  Community Resource Referral / Chronic Care Management: CRR required this visit?  No   CCM required this visit?  Appt scheduled with PCP     Plan:     I have personally reviewed and noted the following in the patient's chart:   Medical and social history Use of alcohol, tobacco or illicit drugs  Current medications and supplements including opioid prescriptions. Patient is not currently taking opioid prescriptions. Functional ability and status Nutritional status Physical activity Advanced directives List of other physicians Hospitalizations, surgeries, and ER visits in previous 12 months Vitals Screenings to include cognitive, depression, and falls Referrals and appointments  In addition, I have reviewed and discussed with patient certain preventive protocols, quality metrics, and best practice recommendations. A written personalized care plan for preventive services as well as general preventive health recommendations were provided to patient.     Earnestine Mealing, MD   09/04/2023   After Visit Summary: (In Person-Printed) AVS printed and given to the patient  Nurse Notes: reviewed

## 2023-11-19 DIAGNOSIS — L570 Actinic keratosis: Secondary | ICD-10-CM | POA: Diagnosis not present

## 2023-12-11 ENCOUNTER — Ambulatory Visit: Payer: Medicare PPO | Admitting: Student

## 2023-12-11 ENCOUNTER — Encounter: Payer: Self-pay | Admitting: Student

## 2023-12-11 VITALS — BP 110/72 | HR 68 | Temp 98.1°F | Ht 60.0 in | Wt 115.0 lb

## 2023-12-11 DIAGNOSIS — M810 Age-related osteoporosis without current pathological fracture: Secondary | ICD-10-CM | POA: Diagnosis not present

## 2023-12-11 MED ORDER — CALTRATE 600+D3 SOFT 600-20 MG-MCG PO CHEW: 1.0000 | CHEWABLE_TABLET | Freq: Every day | ORAL | 3 refills | Status: AC

## 2023-12-11 NOTE — Progress Notes (Signed)
 Location:   [facility]:   [facility]  Provider: Abdul  Code Status: Full Code Goals of Care:     09/04/2023    2:23 PM  Advanced Directives  Does Patient Have a Medical Advance Directive? Yes  Type of Advance Directive Healthcare Power of Attorney  Does patient want to make changes to medical advance directive? No - Patient declined  Copy of Healthcare Power of Attorney in Chart? Yes - validated most recent copy scanned in chart (See row information)     Chief Complaint  Patient presents with   Medical Management of Chronic Issues    3 month follow-up.     HPI: Patient is a 89 y.o. female seen today for medical management of chronic diseases.   Discussed the use of AI scribe software for clinical note transcription with the patient, who gave verbal consent to proceed.  History of Present Illness   Shelby Cruz is an 89 year old female with osteoporosis who presents for routine follow-up.  She is managing osteoporosis with risedronate  (Actonel ), taken once a month, and is tolerating it well without adverse effects, unlike a previous medication that caused nausea.  She takes vitamin D  daily before breakfast but does not use a calcium supplement. She consumes calcium-fortified almond milk and regularly eats cheese, which she finds convenient for sandwiches. She has not used calcium supplements in the past.  She has a history of rosacea but reports no significant issues recently, stating 'not any more than what I usually do.' She finds wintertime beneficial due to less sun irritation and prefers wearing hats over using sunscreen, which she finds messy.       History reviewed. No pertinent past medical history.  Past Surgical History:  Procedure Laterality Date   BREAST BIOPSY Right 1963   negative    Allergies  Allergen Reactions   Bee Pollen    Molds & Smuts     Outpatient Encounter Medications as of 12/11/2023  Medication Sig    acetaminophen (TYLENOL) 500 MG tablet Take 500 mg by mouth every 6 (six) hours as needed.   Biotin 5000 MCG CAPS Take 1 capsule by mouth daily.   Calcium Carb-Cholecalciferol (CALTRATE 600+D3 SOFT) 600-20 MG-MCG CHEW Chew 1 capsule by mouth daily.   cetirizine (ZYRTEC) 10 MG tablet Take 10 mg by mouth daily.   Cholecalciferol (VITAMIN D3) 50 MCG (2000 UT) capsule Take 2,000 Units by mouth daily.   ezetimibe  (ZETIA ) 10 MG tablet Take 10 mg by mouth daily.   metroNIDAZOLE (METROCREAM) 0.75 % cream Apply 1 Application topically 2 (two) times daily.   mupirocin  ointment (BACTROBAN ) 2 % Apply 1 Application topically 2 (two) times daily.   risedronate  (ACTONEL ) 150 MG tablet Take 1 tablet (150 mg total) by mouth every 30 (thirty) days. with water on empty stomach, nothing by mouth or lie down for next 30 minutes.   triamcinolone  cream (KENALOG ) 0.1 % Apply 1 Application topically 2 (two) times daily.   No facility-administered encounter medications on file as of 12/11/2023.    Review of Systems:  Review of Systems  Health Maintenance  Topic Date Due   COVID-19 Vaccine (10 - 2024-25 season) 07/04/2024 (Originally 09/25/2023)   Medicare Annual Wellness (AWV)  09/03/2024   DTaP/Tdap/Td (3 - Td or Tdap) 06/09/2033   Pneumonia Vaccine 53+ Years old  Completed   INFLUENZA VACCINE  Completed   DEXA SCAN  Completed   Zoster Vaccines- Shingrix  Completed  HPV VACCINES  Aged Out    Physical Exam: Vitals:   12/11/23 0834  BP: 110/72  Pulse: 68  Temp: 98.1 F (36.7 C)  SpO2: 94%  Weight: 115 lb (52.2 kg)  Height: 5' (1.524 m)   Body mass index is 22.46 kg/m. Physical Exam Constitutional:      Appearance: Normal appearance.  Cardiovascular:     Rate and Rhythm: Normal rate and regular rhythm.     Pulses: Normal pulses.     Heart sounds: Normal heart sounds.  Pulmonary:     Effort: Pulmonary effort is normal.  Abdominal:     General: Abdomen is flat. Bowel sounds are normal.      Palpations: Abdomen is soft.  Musculoskeletal:        General: No swelling or tenderness.  Skin:    General: Skin is warm and dry.  Neurological:     Mental Status: She is alert and oriented to person, place, and time.     Gait: Gait normal.     Comments: Generalized tremor  Psychiatric:        Mood and Affect: Mood normal.     Labs reviewed: Basic Metabolic Panel: Recent Labs    08/06/23 0740  NA 143  K 3.9  CL 106  CO2 29  GLUCOSE 65  BUN 17  CREATININE 0.82  CALCIUM 8.7   Liver Function Tests: Recent Labs    08/06/23 0740  AST 22  ALT 13  BILITOT 0.5  PROT 6.7   No results for input(s): LIPASE, AMYLASE in the last 8760 hours. No results for input(s): AMMONIA in the last 8760 hours. CBC: Recent Labs    08/06/23 0740  WBC 6.3  NEUTROABS 2,955  HGB 13.2  HCT 40.0  MCV 104.4*  PLT 242   Lipid Panel: Recent Labs    08/06/23 0740  CHOL 143  HDL 56  LDLCALC 72  TRIG 69  CHOLHDL 2.6   Lab Results  Component Value Date   HGBA1C 5.9 (H) 08/06/2023    Procedures since last visit: No results found. Results           Assessment/Plan Assessment and Plan    Osteoporosis Osteoporosis managed with risedronate  (Actonel ) once a month. She tolerates the medication well with no significant side effects. Previous medication caused nausea and was discontinued. Discussed the importance of calcium and vitamin D  for bone health. Recommended Caltrate 600+D to meet the daily intake of 1200 mg of calcium and 2000 IU of vitamin D . - Continue risedronate  (Actonel ) once a month - Add Caltrate 600+D for calcium and vitamin D  supplementation  General Health Maintenance Blood pressure and weight are within normal limits. She is currently taking vitamin D  supplementation but not calcium. Discussed the importance of calcium and vitamin D  for bone health. Recommended Caltrate 600+D to ensure adequate intake. - Continue vitamin D  supplementation - Add Caltrate  600+D for calcium and vitamin D  supplementation  Rosacea Rosacea, well-managed with no recent exacerbations. She avoids sun exposure and does not use sunscreen due to personal preference. Wintertime has reduced sun irritation. - Continue current management and sun avoidance measures.         Labs/tests ordered:  * No order type specified * Next appt:  Visit date not found

## 2023-12-11 NOTE — Patient Instructions (Addendum)
 VISIT SUMMARY:  Today, we reviewed your ongoing management of osteoporosis and discussed your general health maintenance and rosacea. You are doing well with your current medications and lifestyle choices, and we have made a few recommendations to further support your health.  YOUR PLAN:  -OSTEOPOROSIS: Osteoporosis is a condition where bones become weak and are more likely to break. You are currently managing it with risedronate  (Actonel ) once a month, which you tolerate well. To ensure you get enough calcium and vitamin D  for bone health, we recommend adding Caltrate 600+D to your daily routine. This will help you meet the daily intake of 1200 mg of calcium and 2000 IU of vitamin D .  -GENERAL HEALTH MAINTENANCE: Your blood pressure and weight are within normal limits. You are taking vitamin D  but not calcium. To support your bone health, we recommend adding Caltrate 600+D to your daily routine to ensure you get enough calcium and vitamin D .  -ROSACEA: Rosacea is a skin condition that causes redness and visible blood vessels on your face. Your rosacea is well-managed, and you have not had any recent flare-ups. You avoid sun exposure, which helps, and prefer wearing hats over using sunscreen. Continue with your current management and sun avoidance measures.  INSTRUCTIONS:  Please continue taking risedronate  (Actonel ) once a month and start taking Caltrate 600+D daily to ensure adequate calcium and vitamin D  intake. Maintain your current vitamin D  supplementation and continue your sun avoidance measures for rosacea. Follow up with us  if you experience any new symptoms or have any concerns.  Please come the Monday before your next appointment to have labs collected. Labs are collected in the clinic at 7:30AM.   Lab Appointment 06/09/24 @ 7:30 am

## 2023-12-15 ENCOUNTER — Other Ambulatory Visit: Payer: Self-pay | Admitting: Student

## 2023-12-15 DIAGNOSIS — Z1231 Encounter for screening mammogram for malignant neoplasm of breast: Secondary | ICD-10-CM

## 2024-01-12 ENCOUNTER — Other Ambulatory Visit: Payer: Self-pay | Admitting: Student

## 2024-01-12 DIAGNOSIS — M810 Age-related osteoporosis without current pathological fracture: Secondary | ICD-10-CM

## 2024-01-18 ENCOUNTER — Other Ambulatory Visit: Payer: Self-pay

## 2024-01-18 ENCOUNTER — Emergency Department
Admission: EM | Admit: 2024-01-18 | Discharge: 2024-01-18 | Disposition: A | Discharge status: Home / Self Care | Attending: Emergency Medicine | Admitting: Emergency Medicine

## 2024-01-18 DIAGNOSIS — R110 Nausea: Secondary | ICD-10-CM | POA: Diagnosis not present

## 2024-01-18 DIAGNOSIS — R112 Nausea with vomiting, unspecified: Secondary | ICD-10-CM | POA: Diagnosis not present

## 2024-01-18 DIAGNOSIS — R1111 Vomiting without nausea: Secondary | ICD-10-CM | POA: Diagnosis not present

## 2024-01-18 DIAGNOSIS — I10 Essential (primary) hypertension: Secondary | ICD-10-CM | POA: Diagnosis not present

## 2024-01-18 DIAGNOSIS — R197 Diarrhea, unspecified: Secondary | ICD-10-CM

## 2024-01-18 DIAGNOSIS — R42 Dizziness and giddiness: Secondary | ICD-10-CM

## 2024-01-18 DIAGNOSIS — N390 Urinary tract infection, site not specified: Secondary | ICD-10-CM

## 2024-01-18 LAB — COMPREHENSIVE METABOLIC PANEL
ALT: 15 U/L (ref 0–44)
AST: 28 U/L (ref 15–41)
Albumin: 4 g/dL (ref 3.5–5.0)
Alkaline Phosphatase: 42 U/L (ref 38–126)
Anion gap: 12 (ref 5–15)
BUN: 18 mg/dL (ref 8–23)
CO2: 23 mmol/L (ref 22–32)
Calcium: 8.9 mg/dL (ref 8.9–10.3)
Chloride: 107 mmol/L (ref 98–111)
Creatinine, Ser: 0.87 mg/dL (ref 0.44–1.00)
GFR, Estimated: >60 mL/min (ref >60)
Glucose, Bld: 145 mg/dL — ABNORMAL HIGH (ref 70–99)
Potassium: 3.6 mmol/L (ref 3.5–5.1)
Sodium: 142 mmol/L (ref 135–145)
Total Bilirubin: 0.5 mg/dL (ref 0.0–1.2)
Total Protein: 7.1 g/dL (ref 6.5–8.1)

## 2024-01-18 LAB — URINALYSIS, W/ REFLEX TO CULTURE (INFECTION SUSPECTED)
Bilirubin Urine: NEGATIVE
Glucose, UA: NEGATIVE mg/dL
Ketones, ur: NEGATIVE mg/dL
Nitrite: NEGATIVE
Protein, ur: NEGATIVE mg/dL
Specific Gravity, Urine: 1.009 (ref 1.005–1.030)
pH: 8 (ref 5.0–8.0)

## 2024-01-18 LAB — CBC WITH DIFFERENTIAL/PLATELET
Abs Immature Granulocytes: 0.02 10*3/uL (ref 0.00–0.07)
Basophils Absolute: 0 10*3/uL (ref 0.0–0.1)
Basophils Relative: 0 %
Eosinophils Absolute: 0.1 10*3/uL (ref 0.0–0.5)
Eosinophils Relative: 2 %
HCT: 40.7 % (ref 36.0–46.0)
Hemoglobin: 13.3 g/dL (ref 12.0–15.0)
Immature Granulocytes: 0 %
Lymphocytes Relative: 29 %
Lymphs Abs: 1.6 10*3/uL (ref 0.7–4.0)
MCH: 34.7 pg — ABNORMAL HIGH (ref 26.0–34.0)
MCHC: 32.7 g/dL (ref 30.0–36.0)
MCV: 106.3 fL — ABNORMAL HIGH (ref 80.0–100.0)
Monocytes Absolute: 0.4 10*3/uL (ref 0.1–1.0)
Monocytes Relative: 8 %
Neutro Abs: 3.4 10*3/uL (ref 1.7–7.7)
Neutrophils Relative %: 61 %
Platelets: 209 10*3/uL (ref 150–400)
RBC: 3.83 MIL/uL — ABNORMAL LOW (ref 3.87–5.11)
RDW: 13.1 % (ref 11.5–15.5)
WBC: 5.5 10*3/uL (ref 4.0–10.5)
nRBC: 0 % (ref 0.0–0.2)

## 2024-01-18 LAB — LIPASE, BLOOD: Lipase: 52 U/L — ABNORMAL HIGH (ref 11–51)

## 2024-01-18 LAB — RESP PANEL BY RT-PCR (RSV, FLU A&B, COVID)  RVPGX2
Influenza A by PCR: NEGATIVE
Influenza B by PCR: NEGATIVE
Resp Syncytial Virus by PCR: NEGATIVE
SARS Coronavirus 2 by RT PCR: NEGATIVE

## 2024-01-18 LAB — LACTIC ACID, PLASMA: Lactic Acid, Venous: 1.6 mmol/L (ref 0.5–1.9)

## 2024-01-18 LAB — TROPONIN I (HIGH SENSITIVITY)
Troponin I (High Sensitivity): 4 ng/L (ref <18)
Troponin I (High Sensitivity): 5 ng/L (ref <18)

## 2024-01-18 MED ORDER — CEPHALEXIN 500 MG PO CAPS
500.0000 mg | ORAL_CAPSULE | Freq: Once | ORAL | Status: AC
  Administered 2024-01-18: 500 mg via ORAL
  Filled 2024-01-18: qty 1

## 2024-01-18 MED ORDER — CEPHALEXIN 500 MG PO CAPS: 500.0000 mg | ORAL_CAPSULE | Freq: Four times a day (QID) | ORAL | 0 refills | Status: AC

## 2024-01-18 MED ORDER — ONDANSETRON HCL 4 MG/2ML IJ SOLN
4.0000 mg | Freq: Once | INTRAMUSCULAR | Status: AC
  Administered 2024-01-18: 4 mg via INTRAVENOUS
  Filled 2024-01-18: qty 2

## 2024-01-18 MED ORDER — LACTATED RINGERS IV BOLUS
1000.0000 mL | Freq: Once | INTRAVENOUS | Status: AC
  Administered 2024-01-18: 1000 mL via INTRAVENOUS

## 2024-01-18 NOTE — ED Triage Notes (Signed)
 Pt to ED via [facility] from [facility]. After breakfast, pt had N/V/D starting this morning. Pt has vomited 3 times. Pt complains of dizziness when opening her eyes. Pt alert and oriented x4. Pt emesis appears clear and yellow.

## 2024-01-18 NOTE — ED Notes (Addendum)
 Hailey RN called [facility] to give report. Facility states she lives in her own housing and a report is not required. Facility was informed that daughter would be bringing pt back with prescription.

## 2024-01-18 NOTE — ED Provider Notes (Signed)
 [facility] Provider Note    Event Date/Time   First MD Initiated Contact with Patient 01/18/24 0825     (approximate)   History   Emesis   HPI  Shelby Cruz is a 89 y.o. female with history of hyperlipidemia, osteoporosis, presenting with nausea, vomiting, diarrhea and dizziness.  Patient states that she was at breakfast, felt dizzy, ambulated to the bathroom and had diarrhea as well as nausea vomiting.  Per EMS, patient is from a facility, had her usual breakfast and then had diarrhea in the bathroom.  Patient states no fever, no abdominal pain, no urinary symptoms, no chest pain or shortness of breath or cough.  She states she felt fine until her symptoms started.  She denies any weakness or numbness.  Denies frequent alcohol use.  On independent review she was seen by her care doctor early February, has history of osteoporosis is on risedronate as well as Caltrate, no history of hypertension, does have history of rosacea.     Physical Exam   Triage Vital Signs: ED Triage Vitals  Encounter Vitals Group     BP      Systolic BP Percentile      Diastolic BP Percentile      Pulse      Resp      Temp      Temp src      SpO2      Weight      Height      Head Circumference      Peak Flow      Pain Score      Pain Loc      Pain Education      Exclude from Growth Chart     Most recent vital signs: Vitals:   01/18/24 1130 01/18/24 1200  BP: 139/67 132/68  Pulse: 61 64  Resp: 16 14  Temp:    SpO2: 97% 96%     General: Awake, no distress.  CV:  Good peripheral perfusion.  Resp:  Normal effort.  Abd:  No distention.  Soft nontender to deep palpation Other:  No focal weakness or numbness, no dysmetria, cranial nerves are intact, pupils are equal and reactive, extraocular movements are intact.   ED Results / Procedures / Treatments   Labs (all labs ordered are listed, but only abnormal results are displayed) Labs Reviewed   COMPREHENSIVE METABOLIC PANEL - Abnormal; Notable for the following components:      Result Value   Glucose, Bld 145 (*)    All other components within normal limits  CBC WITH DIFFERENTIAL/PLATELET - Abnormal; Notable for the following components:   RBC 3.83 (*)    MCV 106.3 (*)    MCH 34.7 (*)    All other components within normal limits  LIPASE, BLOOD - Abnormal; Notable for the following components:   Lipase 52 (*)    All other components within normal limits  URINALYSIS, W/ REFLEX TO CULTURE (INFECTION SUSPECTED) - Abnormal; Notable for the following components:   Color, Urine YELLOW (*)    APPearance CLOUDY (*)    Hgb urine dipstick SMALL (*)    Leukocytes,Ua SMALL (*)    Bacteria, UA RARE (*)    All other components within normal limits  RESP PANEL BY RT-PCR (RSV, FLU A&B, COVID)  [id number]  GASTROINTESTINAL PANEL BY PCR, STOOL (REPLACES STOOL CULTURE)  C DIFFICILE QUICK SCREEN W PCR REFLEX    LACTIC ACID, PLASMA  TROPONIN I (HIGH  SENSITIVITY)  TROPONIN I (HIGH SENSITIVITY)     EKG  Sinus rhythm, rate of 57, normal QRS, normal QTc, no ischemic ST elevation, T wave flattening in aVL,    PROCEDURES:  Critical Care performed: No  Procedures   MEDICATIONS ORDERED IN ED: Medications  cephALEXin (KEFLEX) capsule 500 mg (has no administration in time range)  lactated ringers bolus 1,000 mL (0 mLs Intravenous Stopped 01/18/24 1042)  ondansetron (ZOFRAN) injection 4 mg (4 mg Intravenous Given 01/18/24 0930)     IMPRESSION / MDM / ASSESSMENT AND PLAN / ED COURSE  I reviewed the triage vital signs and the nursing notes.                              Differential diagnosis includes, but is not limited to, food poisoning, gastroenteritis, norovirus, electrolyte derangements, no focal deficits at this time to suggest CVA.  Patient also states that she was able to ambulate to the bathroom while she was dizzy without any issues or gait instability.  Get labs, EKG, troponin, IV  fluids, Zofran.  Reassess.  Patient's presentation is most consistent with acute presentation with potential threat to life or bodily function.  Independent review of labs are below.  Considered but no indication for inpatient mission at this time, she is safe for outpatient management.  Will discharge with strict precautions.  Clinical Course as of 01/18/24 1354  Mon Jan 18, 2024  1341 Independent review of labs, Trope x 2 is negative, lactate not elevated, no leukocytosis, electrolytes not severely deranged, creatinine is normal, respiratory viral panel is negative, lipase is mildly elevated but she has no epigastric pain.  UA is consistent with mild UTI.  Will start her on antibiotics here and discharge her antibiotics. [TT]  1352 On reassessment patient is feeling lot better, was able to get her up to ambulate, she says she felt slightly lightheaded when ambulating but she has steady gait.  Discussed with patient and daughter were agreeable with plan for discharge, will give her a dose of Keflex here and discharged with antibiotics.  Also recommended keeping a close eye on her hydration status and keeping her hydrated with Pedialyte. [TT]    Clinical Course User Index [TT] Claybon Jabs, MD     FINAL CLINICAL IMPRESSION(S) / ED DIAGNOSES   Final diagnoses:  Dizziness  Nausea vomiting and diarrhea  Urinary tract infection without hematuria, site unspecified     Rx / DC Orders   ED Discharge Orders          Ordered    cephALEXin (KEFLEX) 500 MG capsule  4 times daily        01/18/24 1342             Note:  This document was prepared using Dragon voice recognition software and may include unintentional dictation errors.    Claybon Jabs, MD 01/18/24 [phone]

## 2024-01-18 NOTE — Discharge Instructions (Addendum)
 Please take the antibiotics as prescribed for urinary tract infection.

## 2024-01-20 ENCOUNTER — Other Ambulatory Visit: Payer: Self-pay

## 2024-01-20 ENCOUNTER — Encounter: Payer: Self-pay | Admitting: Emergency Medicine

## 2024-01-20 ENCOUNTER — Ambulatory Visit
Admission: EM | Admit: 2024-01-20 | Discharge: 2024-01-20 | Disposition: A | Discharge status: Home / Self Care | Attending: Emergency Medicine | Admitting: Emergency Medicine

## 2024-01-20 DIAGNOSIS — S51831A Puncture wound without foreign body of right forearm, initial encounter: Secondary | ICD-10-CM

## 2024-01-20 MED ORDER — DOXYCYCLINE HYCLATE 100 MG PO CAPS: 100.0000 mg | ORAL_CAPSULE | Freq: Two times a day (BID) | ORAL | 0 refills | Status: DC

## 2024-01-20 NOTE — Discharge Instructions (Signed)
 Your evaluated for your right arm which is consistent with infection  Typically cephalexin is used to treat skin infections however as symptoms started while you are taking medicine will add additional antibiotic for more coverage, continue cephalexin for treatment of UTI  Take doxycycline every morning and every evening for 7 days  You may apply ice or heat over the affected area 10 to 15-minute intervals  You may elevate whenever sitting on pillows to help reduce swelling  You may take Tylenol as needed for pain  Please follow-up for any concerns

## 2024-01-20 NOTE — ED Provider Notes (Signed)
 Renaldo Fiddler    CSN: [id number] Arrival date & time: 01/20/24  1230      History   Chief Complaint Chief Complaint  Patient presents with   Insect Bite    HPI Shelby Cruz is a 89 y.o. female.   Patient presents for evaluation of erythema and swelling present to the right forearm beginning 1 day ago.  Believed to be possible insect bite as she was gardening 3 to 4 days ago.  Currently taking cephalexin for treatment of UTI.  Denies presence of fever or drainage.  History reviewed. No pertinent past medical history.  Patient Active Problem List   Diagnosis Date Noted   Age-related osteoporosis without current pathological fracture 05/27/2023   Prediabetes 09/02/2022   Alopecia 07/29/2019   Rosacea 01/15/2017   Vitamin D deficiency 01/15/2017   Pure hypercholesterolemia 07/15/2016    Past Surgical History:  Procedure Laterality Date   BREAST BIOPSY Right 1963   negative    OB History   No obstetric history on file.      Home Medications    Prior to Admission medications   Medication Sig Start Date End Date Taking? Authorizing Provider  cephALEXin (KEFLEX) 500 MG capsule Take 1 capsule (500 mg total) by mouth 4 (four) times daily for 5 days. 01/18/24 01/23/24 Yes Claybon Jabs, MD  doxycycline (VIBRAMYCIN) 100 MG capsule Take 1 capsule (100 mg total) by mouth 2 (two) times daily. 01/20/24  Yes Bertha Earwood, Elita Boone, NP  acetaminophen (TYLENOL) 500 MG tablet Take 500 mg by mouth every 6 (six) hours as needed.    [provider]  Biotin 5000 MCG CAPS Take 1 capsule by mouth daily.    [provider]  Calcium Carb-Cholecalciferol (CALTRATE 600+D3 SOFT) 600-20 MG-MCG CHEW Chew 1 capsule by mouth daily. 12/11/23   Earnestine Mealing, MD  cetirizine (ZYRTEC) 10 MG tablet Take 10 mg by mouth daily.    [provider]  Cholecalciferol (VITAMIN D3) 50 MCG (2000 UT) capsule Take 2,000 Units by mouth daily.    [provider]  ezetimibe  (ZETIA) 10 MG tablet Take 10 mg by mouth daily.    [provider]  metroNIDAZOLE (METROCREAM) 0.75 % cream Apply 1 Application topically 2 (two) times daily.    [provider]  mupirocin ointment (BACTROBAN) 2 % Apply 1 Application topically 2 (two) times daily. 05/27/23   Earnestine Mealing, MD  risedronate (ACTONEL) 150 MG tablet TAKE 1 TABLET BY MOUTH EVERY 30 DAYS WITH WATER ON EMPTY STOMACH. NOTHING BY MOUTH OR LIE DOWN FOR NEXT 30 MINUTES AS DIRECTED 01/12/24   Earnestine Mealing, MD  triamcinolone cream (KENALOG) 0.1 % Apply 1 Application topically 2 (two) times daily. 05/27/23   Earnestine Mealing, MD    Family History Family History  Problem Relation Age of Onset   Breast cancer Neg Hx     Social History Social History   Tobacco Use   Smoking status: Never   Smokeless tobacco: Never  Substance Use Topics   Alcohol use: Never   Drug use: Never     Allergies   Bee pollen and Molds & smuts   Review of Systems Review of Systems   Physical Exam Triage Vital Signs ED Triage Vitals [01/20/24 1315]  Encounter Vitals Group     BP 109/73     Systolic BP Percentile      Diastolic BP Percentile      Pulse Rate 73     Resp 18  Temp 97.8 F (36.6 C)     Temp Source Oral     SpO2 96 %     Weight      Height      Head Circumference      Peak Flow      Pain Score 4     Pain Loc      Pain Education      Exclude from Growth Chart    No data found.  Updated Vital Signs BP 109/73 (BP Location: Right Arm)   Pulse 73   Temp 97.8 F (36.6 C) (Oral)   Resp 18   SpO2 96%   Visual Acuity Right Eye Distance:   Left Eye Distance:   Bilateral Distance:    Right Eye Near:   Left Eye Near:    Bilateral Near:     Physical Exam Constitutional:      Appearance: Normal appearance.  Eyes:     Extraocular Movements: Extraocular movements intact.  Pulmonary:     Effort: Pulmonary effort is normal.  Skin:    Comments: Less then 0.5 cm puncture wound  present to the middle of the anterior right forearm, surrounding moderate swelling, erythema, skin hot to touch and tender to palpation, 2+ brachial pulse  Neurological:     Mental Status: She is alert and oriented to person, place, and time.      UC Treatments / Results  Labs (all labs ordered are listed, but only abnormal results are displayed) Labs Reviewed - No data to display  EKG   Radiology No results found.  Procedures Procedures (including critical care time)  Medications Ordered in UC Medications - No data to display  Initial Impression / Assessment and Plan / UC Course  I have reviewed the triage vital signs and the nursing notes.  Pertinent labs & imaging results that were available during my care of the patient were reviewed by me and considered in my medical decision making (see chart for details).  Puncture wound of right forearm, initial encounter  Possible bug bite, no signs of tissue necrosis, discussed this with patient, presentation consistent with infection, currently taking cephalexin while symptoms began therefore adding additional antibiotic, prescribed doxycycline recommended supportive care and advised follow-up for persisting symptoms Final Clinical Impressions(s) / UC Diagnoses   Final diagnoses:  Puncture wound of right forearm, initial encounter     Discharge Instructions      Your evaluated for your right arm which is consistent with infection  Typically cephalexin is used to treat skin infections however as symptoms started while you are taking medicine will add additional antibiotic for more coverage, continue cephalexin for treatment of UTI  Take doxycycline every morning and every evening for 7 days  You may apply ice or heat over the affected area 10 to 15-minute intervals  You may elevate whenever sitting on pillows to help reduce swelling  You may take Tylenol as needed for pain  Please follow-up for any concerns   ED  Prescriptions     Medication Sig Dispense Auth. Provider   doxycycline (VIBRAMYCIN) 100 MG capsule Take 1 capsule (100 mg total) by mouth 2 (two) times daily. 14 capsule Roberta Kelly, Elita Boone, NP      PDMP not reviewed this encounter.   Valinda Hoar, NP 01/20/24 1356

## 2024-01-20 NOTE — ED Triage Notes (Signed)
 Patient presents to [facility] for evaluation of bug bite, possibly since Sunday.  Patient states she was seen in the ED on Monday due to dizziness, emesis and diarrhea.  She noticed inflammation and redness yesterday to her arm, but was gardening on Sunday.  Denies fever.  Is currently taking Keflex for UTI (from hospital visit).

## 2024-01-21 ENCOUNTER — Encounter: Payer: Self-pay | Admitting: Nurse Practitioner

## 2024-01-21 ENCOUNTER — Ambulatory Visit: Admitting: Nurse Practitioner

## 2024-01-21 VITALS — BP 118/82 | HR 72 | Temp 97.9°F | Ht 60.0 in | Wt 114.0 lb

## 2024-01-21 DIAGNOSIS — L03113 Cellulitis of right upper limb: Secondary | ICD-10-CM | POA: Diagnosis not present

## 2024-01-21 NOTE — Progress Notes (Signed)
 Careteam: Patient Care Team: Earnestine Mealing, MD as PCP - General (Family Medicine) Dingeldein, Viviann Spare, MD (Ophthalmology) Verneita Griffes, PZ-C (Dermatology) PLACE OF SERVICE:  [facility]   Advanced Directive information    Allergies  Allergen Reactions   Bee Pollen    Molds & Smuts     Chief Complaint  Patient presents with   Insect Bite    Insect Bite over the weekend to Right Forearm. Redness and itching. Went to Urgent Care yesterday and given an antibiotic.     Discussed the use of AI scribe software for clinical note transcription with the patient, who gave verbal consent to proceed.  History of Present Illness   Shelby Cruz is an 89 year old female who presents with an insect bite on her right forearm.  She noticed the insect bite on her right forearm yesterday. The area was initially red and swollen. She did not see the insect that caused the bite but assumed it was an insect bite.  She visited urgent care yesterday and was prescribed doxycycline, one tablet by mouth twice daily, to be taken with food.  She reports swelling and redness has decreased since starting the antibiotic. She has been using an ice pack intermittently, which she believes has helped reduce the swelling.  No fever or significant pain in the arm, although it felt warmer initially than it does now.      Review of Systems:  Review of Systems  Constitutional:  Negative for chills and fever.  Musculoskeletal:  Negative for joint pain and myalgias.  Skin:        Warm and red to right arm    History reviewed. No pertinent past medical history. Past Surgical History:  Procedure Laterality Date   BREAST BIOPSY Right 1963   negative   Social History:   reports that she has never smoked. She has never used smokeless tobacco. She reports that she does not drink alcohol and does not use drugs.  Family History  Problem Relation Age of Onset   Breast cancer Neg Hx      Medications: Patient's Medications  New Prescriptions   No medications on file  Previous Medications   ACETAMINOPHEN (TYLENOL) 500 MG TABLET    Take 500 mg by mouth every 6 (six) hours as needed.   BIOTIN 5000 MCG CAPS    Take 1 capsule by mouth daily.   CALCIUM CARB-CHOLECALCIFEROL (CALTRATE 600+D3 SOFT) 600-20 MG-MCG CHEW    Chew 1 capsule by mouth daily.   CEPHALEXIN (KEFLEX) 500 MG CAPSULE    Take 1 capsule (500 mg total) by mouth 4 (four) times daily for 5 days.   CETIRIZINE (ZYRTEC) 10 MG TABLET    Take 10 mg by mouth daily.   CHOLECALCIFEROL (VITAMIN D3) 50 MCG (2000 UT) CAPSULE    Take 2,000 Units by mouth daily.   DOXYCYCLINE (VIBRAMYCIN) 100 MG CAPSULE    Take 1 capsule (100 mg total) by mouth 2 (two) times daily.   EZETIMIBE (ZETIA) 10 MG TABLET    Take 10 mg by mouth daily.   METRONIDAZOLE (METROCREAM) 0.75 % CREAM    Apply 1 Application topically 2 (two) times daily.   MUPIROCIN OINTMENT (BACTROBAN) 2 %    Apply 1 Application topically 2 (two) times daily.   RISEDRONATE (ACTONEL) 150 MG TABLET    TAKE 1 TABLET BY MOUTH EVERY 30 DAYS WITH WATER ON EMPTY STOMACH. NOTHING BY MOUTH OR LIE DOWN FOR NEXT 30 MINUTES AS DIRECTED  TRIAMCINOLONE CREAM (KENALOG) 0.1 %    Apply 1 Application topically 2 (two) times daily.  Modified Medications   No medications on file  Discontinued Medications   No medications on file    Physical Exam:  Vitals:   01/21/24 0851  BP: 118/82  Pulse: 72  Temp: 97.9 F (36.6 C)  SpO2: 96%  Weight: 114 lb (51.7 kg)  Height: 5' (1.524 m)   Body mass index is 22.26 kg/m. Wt Readings from Last 3 Encounters:  01/21/24 114 lb (51.7 kg)  01/18/24 119 lb (54 kg)  12/11/23 115 lb (52.2 kg)    Physical Exam Constitutional:      Appearance: Normal appearance.  Pulmonary:     Effort: Pulmonary effort is normal.  Skin:    Findings: Erythema (and swelling to right forearm) present.  Neurological:     Mental Status: She is alert. Mental status  is at baseline.  Psychiatric:        Mood and Affect: Mood normal.     Labs reviewed: Basic Metabolic Panel: Recent Labs    08/06/23 0740 01/18/24 0914  NA 143 142  K 3.9 3.6  CL 106 107  CO2 29 23  GLUCOSE 65 145*  BUN 17 18  CREATININE 0.82 0.87  CALCIUM 8.7 8.9   Liver Function Tests: Recent Labs    08/06/23 0740 01/18/24 0914  AST 22 28  ALT 13 15  ALKPHOS  --  42  BILITOT 0.5 0.5  PROT 6.7 7.1  ALBUMIN  --  4.0   Recent Labs    01/18/24 0914  LIPASE 52*   No results for input(s): "AMMONIA" in the last 8760 hours. CBC: Recent Labs    08/06/23 0740 01/18/24 0914  WBC 6.3 5.5  NEUTROABS 2,955 3.4  HGB 13.2 13.3  HCT 40.0 40.7  MCV 104.4* 106.3*  PLT 242 209   Lipid Panel: Recent Labs    08/06/23 0740  CHOL 143  HDL 56  LDLCALC 72  TRIG 69  CHOLHDL 2.6   TSH: No results for input(s): "TSH" in the last 8760 hours. A1C: Lab Results  Component Value Date   HGBA1C 5.9 (H) 08/06/2023     Assessment/Plan Assessment and Plan    Cellulitis of the right forearm Improvement with doxycycline; area warm, slightly swollen, no fever or significant pain. - Continue doxycycline 100 mg orally twice daily until completion. - Take doxycycline with food to prevent gastrointestinal upset. Loraine Leriche area of redness, monitor for extension. - Apply ice packs for swelling. - Follow up with [place] nurse next weeks to ensure improvement.      Janene Harvey. Biagio Borg  [facility] [phone]

## 2024-02-26 ENCOUNTER — Other Ambulatory Visit: Payer: Self-pay | Admitting: Student

## 2024-02-26 MED ORDER — EZETIMIBE 10 MG PO TABS: 10.0000 mg | ORAL_TABLET | Freq: Every day | ORAL | 1 refills | Status: DC

## 2024-02-26 NOTE — Telephone Encounter (Signed)
 Copied from CRM [id number]. Topic: Clinical - Medication Refill >> Feb 26, 2024  1:15 PM Maryln Sober wrote: Most Recent Primary Care Visit:  Provider: Verma Gobble  Department: [facility]  Visit Type: [facility] LIVING  Date: 01/21/2024  Medication: ezetimibe (ZETIA) 10 MG tablet  Has the patient contacted their pharmacy? Yes Advised to contact her provider   Is this the correct pharmacy for this prescription? Yes If no, delete pharmacy and type the correct one.  This is the patient's preferred pharmacy:  [id number] - [place], [place] - [address] AT [facility] [address] [facility] [address] [place] [address] Phone: [phone] Fax: [phone]   Has the prescription been filled recently? No  Is the patient out of the medication? Yes  Has the patient been seen for an appointment in the last year OR does the patient have an upcoming appointment? Yes  Can we respond through MyChart? No  Agent: Please be advised that Rx refills may take up to 3 business days. We ask that you follow-up with your pharmacy.

## 2024-03-01 ENCOUNTER — Ambulatory Visit
Admission: RE | Admit: 2024-03-01 | Discharge: 2024-03-01 | Disposition: A | Discharge status: Home / Self Care | Payer: Medicare PPO | Source: Ambulatory Visit | Attending: Student | Admitting: Student

## 2024-03-01 DIAGNOSIS — Z1231 Encounter for screening mammogram for malignant neoplasm of breast: Secondary | ICD-10-CM | POA: Diagnosis not present

## 2024-03-04 ENCOUNTER — Encounter: Payer: Self-pay | Admitting: Student

## 2024-03-07 DIAGNOSIS — L905 Scar conditions and fibrosis of skin: Secondary | ICD-10-CM | POA: Diagnosis not present

## 2024-03-07 DIAGNOSIS — L719 Rosacea, unspecified: Secondary | ICD-10-CM | POA: Diagnosis not present

## 2024-03-07 DIAGNOSIS — L814 Other melanin hyperpigmentation: Secondary | ICD-10-CM | POA: Diagnosis not present

## 2024-03-07 DIAGNOSIS — L821 Other seborrheic keratosis: Secondary | ICD-10-CM | POA: Diagnosis not present

## 2024-04-06 ENCOUNTER — Encounter: Payer: Self-pay | Admitting: *Deleted

## 2024-04-06 ENCOUNTER — Telehealth: Payer: Self-pay | Admitting: *Deleted

## 2024-04-06 DIAGNOSIS — Z8619 Personal history of other infectious and parasitic diseases: Secondary | ICD-10-CM

## 2024-04-06 NOTE — Telephone Encounter (Signed)
 Patient would like those labs to help ease her mind

## 2024-04-06 NOTE — Telephone Encounter (Signed)
 If she had the infection in the past, she is unlikely to need the vaccination. I'm happy to order the lab for her to confirm immunity.

## 2024-04-06 NOTE — Telephone Encounter (Signed)
 Patient would like to have titers collected to confirm immunity. Please notify the order is placed. She can come to the clinic Thursday or Monday mornings at 7:30 AM to have them collected starting 6/9.

## 2024-04-06 NOTE — Telephone Encounter (Signed)
 MMR Vaccination is recommended for people who were born after [birth year]. The only way we can confirm that she has had previous exposure would be to collect blood to check for antibodies. The alternative would be to get a one time vaccination to help prevent infection. I think either option is reasonable. If we get blood and she does not have antibodies, the vaccination would be recommended anyway.  Let me know your preference.  VB

## 2024-04-06 NOTE — Telephone Encounter (Signed)
 Patient called and stated that she is going to be traveling and wonders if she should get the MMR Vaccine.   Please Advise.    NCIR

## 2024-04-06 NOTE — Telephone Encounter (Signed)
 Called patient she started she had Mumps when she was younger in grade school and has never had the vaccine she would like to go ahead and come into clinic to do lab work as soon as possible, please advise

## 2024-04-07 NOTE — Telephone Encounter (Signed)
 Patient notified and agreed. Will come on 04/11/2024 to have drawn

## 2024-04-07 NOTE — Telephone Encounter (Signed)
 Valrie Gehrig, MD  You; Homero Luster, Milana Ali A12 hours ago (8:34 PM)   VB Good Morning Shelby Cruz! Please print these labs so they can get collected for [facility]. Please let her know she can come Monday or Thursday.      Tried calling patient but call will not go through. Will try again later.

## 2024-04-12 LAB — MEASLES/MUMPS/RUBELLA IMMUNITY
Mumps IgG: 242 [AU]/ml
Rubella: 25.2 {index}
Rubeola IgG: >300 [AU]/ml

## 2024-04-13 ENCOUNTER — Ambulatory Visit: Payer: Self-pay | Admitting: Student

## 2024-06-09 DIAGNOSIS — D649 Anemia, unspecified: Secondary | ICD-10-CM | POA: Diagnosis not present

## 2024-06-09 DIAGNOSIS — M810 Age-related osteoporosis without current pathological fracture: Secondary | ICD-10-CM | POA: Diagnosis not present

## 2024-06-09 LAB — CBC WITH DIFFERENTIAL/PLATELET
Absolute Lymphocytes: 4126 {cells}/uL — ABNORMAL HIGH (ref 850–3900)
Absolute Monocytes: 662 {cells}/uL (ref 200–950)
Basophils Absolute: 43 {cells}/uL (ref 0–200)
Basophils Relative: 0.6 %
Eosinophils Absolute: 302 {cells}/uL (ref 15–500)
Eosinophils Relative: 4.2 %
HCT: 39.6 % (ref 35.0–45.0)
Hemoglobin: 13 g/dL (ref 11.7–15.5)
MCH: 34.4 pg — ABNORMAL HIGH (ref 27.0–33.0)
MCHC: 32.8 g/dL (ref 32.0–36.0)
MCV: 104.8 fL — ABNORMAL HIGH (ref 80.0–100.0)
MPV: 10.8 fL (ref 7.5–12.5)
Monocytes Relative: 9.2 %
Neutro Abs: 2066 {cells}/uL (ref 1500–7800)
Neutrophils Relative %: 28.7 %
Platelets: 216 Thousand/uL (ref 140–400)
RBC: 3.78 Million/uL — ABNORMAL LOW (ref 3.80–5.10)
RDW: 12.9 % (ref 11.0–15.0)
Total Lymphocyte: 57.3 %
WBC: 7.2 Thousand/uL (ref 3.8–10.8)

## 2024-06-09 LAB — COMPREHENSIVE METABOLIC PANEL WITH GFR
AG Ratio: 1.5 (calc) (ref 1.0–2.5)
ALT: 9 U/L (ref 6–29)
AST: 22 U/L (ref 10–35)
Albumin: 4.3 g/dL (ref 3.6–5.1)
Alkaline phosphatase (APISO): 47 U/L (ref 37–153)
BUN: 22 mg/dL (ref 7–25)
CO2: 29 mmol/L (ref 20–32)
Calcium: 9.3 mg/dL (ref 8.6–10.4)
Chloride: 104 mmol/L (ref 98–110)
Creat: 0.9 mg/dL (ref 0.60–0.95)
Globulin: 2.8 g/dL (ref 1.9–3.7)
Glucose, Bld: 93 mg/dL (ref 65–99)
Potassium: 3.6 mmol/L (ref 3.5–5.3)
Sodium: 141 mmol/L (ref 135–146)
Total Bilirubin: 0.6 mg/dL (ref 0.2–1.2)
Total Protein: 7.1 g/dL (ref 6.1–8.1)
eGFR: 61 mL/min/1.73m2 (ref >=60)

## 2024-06-09 LAB — VITAMIN D 25 HYDROXY (VIT D DEFICIENCY, FRACTURES): Vit D, 25-Hydroxy: 53 ng/mL (ref 30–100)

## 2024-06-14 ENCOUNTER — Ambulatory Visit: Payer: Self-pay | Admitting: Student

## 2024-06-15 ENCOUNTER — Encounter: Payer: Self-pay | Admitting: Student

## 2024-06-15 ENCOUNTER — Ambulatory Visit: Payer: Medicare PPO | Admitting: Student

## 2024-06-15 VITALS — BP 126/68 | HR 64 | Temp 98.0°F | Ht 60.0 in | Wt 116.2 lb

## 2024-06-15 DIAGNOSIS — E7800 Pure hypercholesterolemia, unspecified: Secondary | ICD-10-CM | POA: Diagnosis not present

## 2024-06-15 DIAGNOSIS — M810 Age-related osteoporosis without current pathological fracture: Secondary | ICD-10-CM | POA: Diagnosis not present

## 2024-06-15 NOTE — Progress Notes (Signed)
 [facility] POS: [facility] Provider: ABDUL  Code Status: Full code Goals of Care:     06/15/2024    1:09 PM  Advanced Directives  Does Patient Have a Medical Advance Directive? Yes  Type of Advance Directive Healthcare Power of Attorney  Does patient want to make changes to medical advance directive? No - Patient declined  Copy of Healthcare Power of Attorney in Chart? Yes - validated most recent copy scanned in chart (See row information)     Chief Complaint  Patient presents with   Medical Management of Chronic Issues    Medical Management of Chronic Issues. 6 Month follow up    HPI: Patient is a 89 y.o. female seen today for medical management of chronic diseases.   Discussed the use of AI scribe software for clinical note transcription with the patient, who gave verbal consent to proceed.  History of Present Illness   Shelby Cruz is an 89 year old female who presents for a routine follow-up visit.  She has been managing her osteoporosis with risedronate , which she finds better tolerated than her previous medication.  In March, she experienced an episode of syncope following vomiting, which was associated with a suspected urinary tract infection. She was treated with antibiotics for cloudy urine with hematuria and bacteriuria. She has since recovered, though the cause of the syncope remains unclear. Her daughter, Corean, provided support by staying with her for a couple of nights after the incident.  She maintains good hydration, consuming four to five Tervis glasses of water daily. She reports no recent viral infections and maintains a good sleep and dietary routine without complaints.  She lives independently at [facility], managing her paperwork with assistance from her neighbor, Cy Barber. Since her spouse's passing over a year ago, she has been living alone but remains supported by her family, including daughters in [place] and on the [address] She engages in activities such as reading, watching TV, napping, and walking around the court when weather permits, enjoying factual books and mysteries.      - Partner Status: Widowed - Living Situation: Lives in [place] community - Patient is turning 89 soon, prefers not to have a party. Has two daughters, one in [place] and one on the [address], and a supportive son-in-law. Engages in activities like reading, watching TV, and walking. Has supportive neighbors and family. Lost spouse over a year ago.   History reviewed. No pertinent past medical history.  Past Surgical History:  Procedure Laterality Date   BREAST BIOPSY Right 1963   negative    Allergies  Allergen Reactions   Bee Pollen    Molds & Smuts     Outpatient Encounter Medications as of 06/15/2024  Medication Sig   acetaminophen (TYLENOL) 500 MG tablet Take 500 mg by mouth every 6 (six) hours as needed.   Biotin 5000 MCG CAPS Take 1 capsule by mouth daily.   Calcium Carb-Cholecalciferol (CALTRATE 600+D3 SOFT) 600-20 MG-MCG CHEW Chew 1 capsule by mouth daily.   cetirizine (ZYRTEC) 10 MG tablet Take 10 mg by mouth daily.   Cholecalciferol (VITAMIN D3) 50 MCG (2000 UT) capsule Take 2,000 Units by mouth daily.   doxycycline  (VIBRAMYCIN ) 100 MG capsule Take 1 capsule (100 mg total) by mouth 2 (two) times daily.   ezetimibe  (ZETIA ) 10 MG tablet Take 1 tablet (10 mg total) by mouth daily.   metroNIDAZOLE (METROCREAM) 0.75 % cream Apply 1 Application topically 2 (two) times daily.  mupirocin  ointment (BACTROBAN ) 2 % Apply 1 Application topically 2 (two) times daily.   risedronate  (ACTONEL ) 150 MG tablet TAKE 1 TABLET BY MOUTH EVERY 30 DAYS WITH WATER ON EMPTY STOMACH. NOTHING BY MOUTH OR LIE DOWN FOR NEXT 30 MINUTES AS DIRECTED   triamcinolone  cream (KENALOG ) 0.1 % Apply 1 Application topically 2 (two) times daily.   No facility-administered encounter medications on file as of 06/15/2024.    Review of Systems:   Review of Systems  Health Maintenance  Topic Date Due   INFLUENZA VACCINE  06/03/2024   COVID-19 Vaccine (11 - Moderna risk 2024-25 season) 08/13/2024   Medicare Annual Wellness (AWV)  09/03/2024   DTaP/Tdap/Td (3 - Td or Tdap) 06/09/2033   Pneumococcal Vaccine: 50+ Years  Completed   DEXA SCAN  Completed   Zoster Vaccines- Shingrix  Completed   Hepatitis B Vaccines  Aged Out   HPV VACCINES  Aged Out   Meningococcal B Vaccine  Aged Out    Physical Exam: Vitals:   06/15/24 1306  BP: 126/68  Pulse: 64  Temp: 98 F (36.7 C)  SpO2: 95%  Weight: 116 lb 3.2 oz (52.7 kg)  Height: 5' (1.524 m)   Body mass index is 22.69 kg/m. Physical Exam Physical Exam   CHEST: Lungs clear to auscultation bilaterally. CARDIOVASCULAR: Heart sounds normal. SKIN: Skin healthy, no lesions.      Labs reviewed: Basic Metabolic Panel: Recent Labs    08/06/23 0740 01/18/24 0914 06/09/24 0810  NA 143 142 141  K 3.9 3.6 3.6  CL 106 107 104  CO2 29 23 29   GLUCOSE 65 145* 93  BUN 17 18 22   CREATININE 0.82 0.87 0.90  CALCIUM 8.7 8.9 9.3   Liver Function Tests: Recent Labs    08/06/23 0740 01/18/24 0914 06/09/24 0810  AST 22 28 22   ALT 13 15 9   ALKPHOS  --  42  --   BILITOT 0.5 0.5 0.6  PROT 6.7 7.1 7.1  ALBUMIN  --  4.0  --    Recent Labs    01/18/24 0914  LIPASE 52*   No results for input(s): AMMONIA in the last 8760 hours. CBC: Recent Labs    08/06/23 0740 01/18/24 0914 06/09/24 0810  WBC 6.3 5.5 7.2  NEUTROABS 2,955 3.4 2,066  HGB 13.2 13.3 13.0  HCT 40.0 40.7 39.6  MCV 104.4* 106.3* 104.8*  PLT 242 209 216   Lipid Panel: Recent Labs    08/06/23 0740  CHOL 143  HDL 56  LDLCALC 72  TRIG 69  CHOLHDL 2.6   Lab Results  Component Value Date   HGBA1C 5.9 (H) 08/06/2023    Procedures since last visit: No results found. Results   LABS   Creatinine: 0.9   GFR: 61   Sodium: 141   Potassium: 3.6   Albumin: 4.3   AST: 22   ALT: 9   Vitamin D : 53    WBC: 7200   Lymphocytes: 4126      Assessment/Plan     Osteoporosis Osteoporosis is well-managed with risedronate , with good kidney function indicated by creatinine at 0.9 and GFR at 61. - Continue risedronate . - Monitor kidney function regularly.  Hyperlipidemia Hyperlipidemia is well-managed with Zetia , with normal liver function tests indicating good tolerance. - Continue Zetia . - Monitor liver function regularly.  General Health Maintenance Vitamin D  level is normal at 53. Slight elevation in lymphocytes may indicate a past viral infection, but no current symptoms are present. - Recheck  lymphocyte levels before the next appointment.        Labs/tests ordered:  * No order type specified * Next appt:  Visit date not found

## 2024-06-15 NOTE — Patient Instructions (Signed)
 VISIT SUMMARY:  During your routine follow-up visit, we reviewed your current health status and management of your ongoing conditions. You are doing well with your osteoporosis and hyperlipidemia treatments, and your general health maintenance is on track.  YOUR PLAN:  -OSTEOPOROSIS: Osteoporosis is a condition where bones become weak and brittle. Your osteoporosis is well-managed with risedronate , and your kidney function is good. Please continue taking risedronate  as prescribed, and we will monitor your kidney function regularly.  -HYPERLIPIDEMIA: Hyperlipidemia is a condition where there are high levels of fats in the blood. Your hyperlipidemia is well-managed with Zetia , and your liver function tests are normal. Please continue taking Zetia  as prescribed, and we will monitor your liver function regularly.  -GENERAL HEALTH MAINTENANCE: Your vitamin D  level is normal, and a slight elevation in lymphocytes may indicate a past viral infection, but you have no current symptoms. We will recheck your lymphocyte levels before your next appointment.  INSTRUCTIONS:  Please continue with your current medications, risedronate  and Zetia , as prescribed. We will monitor your kidney and liver functions regularly. Additionally, we will recheck your lymphocyte levels before your next appointment.

## 2024-06-30 ENCOUNTER — Ambulatory Visit: Admitting: Nurse Practitioner

## 2024-06-30 ENCOUNTER — Encounter: Payer: Self-pay | Admitting: Nurse Practitioner

## 2024-06-30 VITALS — BP 124/76 | HR 78 | Temp 98.2°F | Ht 60.0 in | Wt 114.4 lb

## 2024-06-30 DIAGNOSIS — I872 Venous insufficiency (chronic) (peripheral): Secondary | ICD-10-CM

## 2024-06-30 NOTE — Patient Instructions (Signed)
-  encouraged to elevate legs above level of heart as tolerates, low sodium diet (avoid processed foods with high sodium, tv dinners, canned food), compression hose as tolerates (on in am, off in pm)

## 2024-06-30 NOTE — Progress Notes (Signed)
 Careteam: Patient Care Team: Abdul Fine, MD as PCP - General (Family Medicine) Dingeldein, Elspeth, MD (Ophthalmology) Camelia Lukes, PZ-C (Dermatology) PLACE OF SERVICE:  [facility]   Advanced Directive information    Allergies  Allergen Reactions   Bee Pollen    Molds & Smuts     Chief Complaint  Patient presents with   Edema    Bilateral Leg Swelling. Sometimes painful.      HPI: Patient is a 89 y.o. female seen in today due to increase swelling to both legs  Discussed the use of AI scribe software for clinical note transcription with the patient, who gave verbal consent to proceed.  History of Present Illness Shelby Cruz is an 89 year old female who presents with swelling in both feet.  She has been experiencing intermittent swelling in both feet for several weeks, with no specific pattern of worsening throughout the day. The swelling is slightly painful and tender, particularly on the front of her feet. She has been elevating her legs at night. No shortness of breath or palpitations.  She is in the process of moving into assisted living and is currently filling out the necessary paperwork. Her diet includes TV dinners or frozen meals, and she does not add salt to her food. She spends a significant amount of time sitting, especially while completing paperwork, and her legs dangle during these periods.  She denies shortness of breath, chest pains, palpitations.   Review of Systems:  Review of Systems  Constitutional:  Negative for chills, fever and weight loss.  HENT:  Negative for tinnitus.   Respiratory:  Negative for cough, sputum production and shortness of breath.   Cardiovascular:  Positive for leg swelling. Negative for chest pain and palpitations.  Gastrointestinal:  Negative for abdominal pain, constipation, diarrhea and heartburn.  Genitourinary:  Negative for dysuria, frequency and urgency.  Musculoskeletal:  Negative for back pain,  falls, joint pain and myalgias.  Skin: Negative.   Neurological:  Negative for dizziness and headaches.  Psychiatric/Behavioral:  Negative for depression. The patient does not have insomnia.        Reports confusion     History reviewed. No pertinent past medical history. Past Surgical History:  Procedure Laterality Date   BREAST BIOPSY Right 1963   negative   Social History:   reports that she has never smoked. She has never used smokeless tobacco. She reports that she does not drink alcohol and does not use drugs.  Family History  Problem Relation Age of Onset   Breast cancer Neg Hx     Medications: Patient's Medications  New Prescriptions   No medications on file  Previous Medications   ACETAMINOPHEN (TYLENOL) 500 MG TABLET    Take 500 mg by mouth every 6 (six) hours as needed.   BIOTIN 5000 MCG CAPS    Take 1 capsule by mouth daily.   CALCIUM CARB-CHOLECALCIFEROL (CALTRATE 600+D3 SOFT) 600-20 MG-MCG CHEW    Chew 1 capsule by mouth daily.   CETIRIZINE (ZYRTEC) 10 MG TABLET    Take 10 mg by mouth daily.   CHOLECALCIFEROL (VITAMIN D3) 50 MCG (2000 UT) CAPSULE    Take 2,000 Units by mouth daily.   DOXYCYCLINE  (VIBRAMYCIN ) 100 MG CAPSULE    Take 1 capsule (100 mg total) by mouth 2 (two) times daily.   EZETIMIBE  (ZETIA ) 10 MG TABLET    Take 1 tablet (10 mg total) by mouth daily.   METRONIDAZOLE (METROCREAM) 0.75 % CREAM  Apply 1 Application topically 2 (two) times daily.   MUPIROCIN  OINTMENT (BACTROBAN ) 2 %    Apply 1 Application topically 2 (two) times daily.   RISEDRONATE  (ACTONEL ) 150 MG TABLET    TAKE 1 TABLET BY MOUTH EVERY 30 DAYS WITH WATER ON EMPTY STOMACH. NOTHING BY MOUTH OR LIE DOWN FOR NEXT 30 MINUTES AS DIRECTED   TRIAMCINOLONE  CREAM (KENALOG ) 0.1 %    Apply 1 Application topically 2 (two) times daily.  Modified Medications   No medications on file  Discontinued Medications   No medications on file    Physical Exam:  Vitals:   06/30/24 1020  BP: 124/76   Pulse: 78  Temp: 98.2 F (36.8 C)  SpO2: 94%  Weight: 114 lb 6.4 oz (51.9 kg)  Height: 5' (1.524 m)   Body mass index is 22.34 kg/m. Wt Readings from Last 3 Encounters:  06/30/24 114 lb 6.4 oz (51.9 kg)  06/15/24 116 lb 3.2 oz (52.7 kg)  01/21/24 114 lb (51.7 kg)    Physical Exam Constitutional:      General: She is not in acute distress.    Appearance: She is well-developed. She is not diaphoretic.  HENT:     Head: Normocephalic and atraumatic.     Mouth/Throat:     Pharynx: No oropharyngeal exudate.  Eyes:     Conjunctiva/sclera: Conjunctivae normal.     Pupils: Pupils are equal, round, and reactive to light.  Cardiovascular:     Rate and Rhythm: Normal rate and regular rhythm.     Heart sounds: Normal heart sounds.  Pulmonary:     Effort: Pulmonary effort is normal.     Breath sounds: Normal breath sounds.  Abdominal:     General: Bowel sounds are normal.     Palpations: Abdomen is soft.  Musculoskeletal:     Cervical back: Normal range of motion and neck supple.     Right lower leg: Edema present.     Left lower leg: Edema present.  Skin:    General: Skin is warm and dry.  Neurological:     Mental Status: She is alert.  Psychiatric:        Mood and Affect: Mood normal.     Labs reviewed: Basic Metabolic Panel: Recent Labs    08/06/23 0740 01/18/24 0914 06/09/24 0810  NA 143 142 141  K 3.9 3.6 3.6  CL 106 107 104  CO2 29 23 29   GLUCOSE 65 145* 93  BUN 17 18 22   CREATININE 0.82 0.87 0.90  CALCIUM 8.7 8.9 9.3   Liver Function Tests: Recent Labs    08/06/23 0740 01/18/24 0914 06/09/24 0810  AST 22 28 22   ALT 13 15 9   ALKPHOS  --  42  --   BILITOT 0.5 0.5 0.6  PROT 6.7 7.1 7.1  ALBUMIN  --  4.0  --    Recent Labs    01/18/24 0914  LIPASE 52*   No results for input(s): AMMONIA in the last 8760 hours. CBC: Recent Labs    08/06/23 0740 01/18/24 0914 06/09/24 0810  WBC 6.3 5.5 7.2  NEUTROABS 2,955 3.4 2,066  HGB 13.2 13.3 13.0   HCT 40.0 40.7 39.6  MCV 104.4* 106.3* 104.8*  PLT 242 209 216   Lipid Panel: Recent Labs    08/06/23 0740  CHOL 143  HDL 56  LDLCALC 72  TRIG 69  CHOLHDL 2.6   TSH: No results for input(s): TSH in the last 8760 hours. A1C: Lab  Results  Component Value Date   HGBA1C 5.9 (H) 08/06/2023     Assessment/Plan  Assessment & Plan Bilateral lower extremity edema due to chronic venous insufficiency Chronic edema likely from venous insufficiency, exacerbated by sodium intake and prolonged sitting. Possible poor nutritional intake as well- she has lost weight since last visit.  - Advise wearing compression hose. - Recommend elevating legs for 45-60 minutes daily in the afternoon - Advise against high sodium intake. - Provide information on obtaining compression hose.   Bayan Kushnir K. Caro BODILY  [facility] [phone]

## 2024-08-04 DIAGNOSIS — F0390 Unspecified dementia without behavioral disturbance: Secondary | ICD-10-CM | POA: Diagnosis not present

## 2024-08-04 DIAGNOSIS — R4182 Altered mental status, unspecified: Secondary | ICD-10-CM | POA: Diagnosis not present

## 2024-08-04 DIAGNOSIS — R410 Disorientation, unspecified: Secondary | ICD-10-CM | POA: Diagnosis not present

## 2024-08-10 ENCOUNTER — Encounter: Payer: Self-pay | Admitting: Adult Health

## 2024-08-10 ENCOUNTER — Non-Acute Institutional Stay: Payer: Self-pay | Admitting: Adult Health

## 2024-08-10 DIAGNOSIS — M8000XD Age-related osteoporosis with current pathological fracture, unspecified site, subsequent encounter for fracture with routine healing: Secondary | ICD-10-CM | POA: Diagnosis not present

## 2024-08-10 DIAGNOSIS — R2681 Unsteadiness on feet: Secondary | ICD-10-CM | POA: Diagnosis not present

## 2024-08-10 DIAGNOSIS — R7303 Prediabetes: Secondary | ICD-10-CM | POA: Diagnosis not present

## 2024-08-10 DIAGNOSIS — E7800 Pure hypercholesterolemia, unspecified: Secondary | ICD-10-CM | POA: Diagnosis not present

## 2024-08-10 DIAGNOSIS — I872 Venous insufficiency (chronic) (peripheral): Secondary | ICD-10-CM | POA: Diagnosis not present

## 2024-08-10 DIAGNOSIS — L719 Rosacea, unspecified: Secondary | ICD-10-CM | POA: Diagnosis not present

## 2024-08-10 DIAGNOSIS — R600 Localized edema: Secondary | ICD-10-CM | POA: Diagnosis not present

## 2024-08-10 DIAGNOSIS — I10 Essential (primary) hypertension: Secondary | ICD-10-CM | POA: Diagnosis not present

## 2024-08-10 DIAGNOSIS — R278 Other lack of coordination: Secondary | ICD-10-CM | POA: Diagnosis not present

## 2024-08-10 DIAGNOSIS — M810 Age-related osteoporosis without current pathological fracture: Secondary | ICD-10-CM

## 2024-08-10 DIAGNOSIS — R2689 Other abnormalities of gait and mobility: Secondary | ICD-10-CM | POA: Diagnosis not present

## 2024-08-10 DIAGNOSIS — M6281 Muscle weakness (generalized): Secondary | ICD-10-CM | POA: Diagnosis not present

## 2024-08-10 NOTE — Progress Notes (Signed)
 Location:  Other [place].  Nursing Home Room Number: [name] Place of Service:  ALF (13) Provider:  Medina-Vargas, Jovi Alvizo, DNP, FNP-BC  Patient Care Team: Abdul Fine, MD as PCP - General (Family Medicine) Dingeldein, Elspeth, MD (Ophthalmology) Camelia Lukes, [facility] (Dermatology)  Extended Emergency Contact Information Primary Emergency Contact: Devereus,Mary Mobile Phone: [phone] Relation: Daughter  Code Status:  Full  Goals of care: Advanced Directive information    06/15/2024    1:09 PM  Advanced Directives  Does Patient Have a Medical Advance Directive? Yes  Type of Advance Directive Healthcare Power of Attorney  Does patient want to make changes to medical advance directive? No - Patient declined  Copy of Healthcare Power of Attorney in Chart? Yes - validated most recent copy scanned in chart (See row information)     Chief Complaint  Patient presents with   Transition to Assisted Living    Transition to Assisted Living.     HPI:  Pt is a 89 y.o. female seen today for Transition to Assisted Living from [facility].  Edema of both lower extremities due to peripheral venous insufficiency -  she has BLE 2+edema, currently having PT and OT, has orders for compression socks, stated that she elevates her legs at night  Age-related osteoporosis without current pathological fracture -  takes Risedronate   Pure hypercholesterolemia -  takes Zetia   Rosacea -  no redness on face, uses Metronidazole 0.75% cream topically BID    History reviewed. No pertinent past medical history. Past Surgical History:  Procedure Laterality Date   BREAST BIOPSY Right 1963   negative    Allergies  Allergen Reactions   Bee Pollen    Molds & Smuts     Outpatient Encounter Medications as of 08/10/2024  Medication Sig   acetaminophen (TYLENOL) 500 MG tablet Take 500 mg by mouth every 6 (six) hours as needed.   aluminum-magnesium  hydroxide 200-200 MG/5ML suspension Take 15 mLs by mouth every 4 (four) hours as needed for indigestion.   Biotin 5000 MCG CAPS Take 1 capsule by mouth daily.   bismuth subsalicylate (PEPTO BISMOL) 262 MG/15ML suspension Take 30 mLs by mouth as needed.   Calcium Carb-Cholecalciferol (CALTRATE 600+D3 SOFT) 600-20 MG-MCG CHEW Chew 1 capsule by mouth daily.   carbamide peroxide (DEBROX) 6.5 % OTIC solution 5 drops as needed.   cetirizine (ZYRTEC) 10 MG tablet Take 10 mg by mouth daily. Give one tablet by mouth as needed   Cholecalciferol (VITAMIN D3) 50 MCG (2000 UT) capsule Take 2,000 Units by mouth daily.   dextromethorphan-guaiFENesin (ROBITUSSIN-DM) 10-100 MG/5ML liquid Take 10 mLs by mouth every 4 (four) hours as needed for cough.   ezetimibe  (ZETIA ) 10 MG tablet Take 1 tablet (10 mg total) by mouth daily.   Glucose 15 GM/32ML GEL Take 1 packet by mouth as needed.   magnesium hydroxide (MILK OF MAGNESIA) 400 MG/5ML suspension Take 30 mLs by mouth daily as needed for mild constipation.   metroNIDAZOLE (METROCREAM) 0.75 % cream Apply 1 Application topically 2 (two) times daily.   mupirocin  ointment (BACTROBAN ) 2 % Apply 1 Application topically 2 (two) times daily.   nystatin (MYCOSTATIN/NYSTOP) powder Apply 1 Application topically 2 (two) times daily.   ondansetron  (ZOFRAN ) 4 MG tablet Take 4 mg by mouth every 8 (eight) hours as needed for nausea or vomiting.   OXYGEN Inhale into the lungs. 2lpm   risedronate  (ACTONEL ) 150 MG tablet TAKE 1 TABLET BY MOUTH EVERY 30 DAYS WITH  WATER ON EMPTY STOMACH. NOTHING BY MOUTH OR LIE DOWN FOR NEXT 30 MINUTES AS DIRECTED   triamcinolone  cream (KENALOG ) 0.1 % Apply 1 Application topically 2 (two) times daily.   No facility-administered encounter medications on file as of 08/10/2024.    Review of Systems  Constitutional:  Negative for appetite change, chills, fatigue and fever.  HENT:  Negative for congestion, hearing loss, rhinorrhea and sore throat.    Eyes: Negative.   Respiratory:  Negative for cough, shortness of breath and wheezing.   Cardiovascular:  Positive for leg swelling. Negative for chest pain and palpitations.  Gastrointestinal:  Negative for abdominal pain, constipation, diarrhea, nausea and vomiting.  Genitourinary:  Negative for dysuria.  Musculoskeletal:  Negative for arthralgias, back pain and myalgias.  Skin:  Negative for color change, rash and wound.  Neurological:  Negative for dizziness, weakness and headaches.  Psychiatric/Behavioral:  Negative for behavioral problems. The patient is not nervous/anxious.        Immunization History  Administered Date(s) Administered   Influenza-Unspecified 08/24/2022, 08/27/2023   Moderna Covid-19 Fall Seasonal Vaccine 21yrs & older 02/10/2023   Moderna Covid-19 Vaccine Bivalent Booster 24yrs & up 07/25/2021, 04/01/2022, 09/12/2022   Moderna Sars-Covid-2 Vaccination 11/18/2019, 12/16/2019, 09/18/2020, 03/21/2021, 07/31/2023   Pneumococcal Conjugate-13 01/12/2014   Pneumococcal Polysaccharide-23 10/03/1999, 11/05/1999   Tdap 07/04/2010, 06/10/2023   Unspecified SARS-COV-2 Vaccination 02/12/2024   Zoster Recombinant(Shingrix) 02/24/2008, 06/10/2023, 09/10/2023   Zoster, Live 02/24/2008   Pertinent  Health Maintenance Due  Topic Date Due   Influenza Vaccine  06/03/2024   DEXA SCAN  Completed      06/05/2023    6:37 PM 08/12/2023    1:07 PM 09/04/2023    2:23 PM 06/15/2024    1:08 PM 06/30/2024   10:22 AM  Fall Risk  Falls in the past year? 1 0 0 0 0  Was there an injury with Fall? 0 0 0 0 0  Fall Risk Category Calculator 1 0 0 0 0  Patient at Risk for Falls Due to    No Fall Risks Impaired balance/gait;No Fall Risks  Fall risk Follow up    Falls evaluation completed Falls evaluation completed     Vitals:   08/10/24 1031  BP: (!) 111/58  Pulse: 80  Resp: 18  Temp: 97.8 F (36.6 C)  SpO2: 98%  Weight: 114 lb (51.7 kg)  Height: 5' (1.524 m)   Body mass index  is 22.26 kg/m.  Physical Exam Constitutional:      General: She is not in acute distress.    Appearance: Normal appearance.  HENT:     Head: Normocephalic and atraumatic.     Nose: Nose normal.     Mouth/Throat:     Mouth: Mucous membranes are moist.  Eyes:     Conjunctiva/sclera: Conjunctivae normal.  Cardiovascular:     Rate and Rhythm: Normal rate and regular rhythm.  Pulmonary:     Effort: Pulmonary effort is normal.     Breath sounds: Normal breath sounds.  Abdominal:     General: Bowel sounds are normal.     Palpations: Abdomen is soft.  Musculoskeletal:        General: Swelling present. Normal range of motion.     Cervical back: Normal range of motion.     Right lower leg: Edema present.     Left lower leg: Edema present.     Comments: BLE 2+edema  Skin:    General: Skin is warm and dry.  Neurological:  General: No focal deficit present.     Mental Status: She is alert and oriented to person, place, and time.  Psychiatric:        Mood and Affect: Mood normal.        Behavior: Behavior normal.      Labs reviewed: Recent Labs    01/18/24 0914 06/09/24 0810  NA 142 141  K 3.6 3.6  CL 107 104  CO2 23 29  GLUCOSE 145* 93  BUN 18 22  CREATININE 0.87 0.90  CALCIUM 8.9 9.3   Recent Labs    01/18/24 0914 06/09/24 0810  AST 28 22  ALT 15 9  ALKPHOS 42  --   BILITOT 0.5 0.6  PROT 7.1 7.1  ALBUMIN 4.0  --    Recent Labs    01/18/24 0914 06/09/24 0810  WBC 5.5 7.2  NEUTROABS 3.4 2,066  HGB 13.3 13.0  HCT 40.7 39.6  MCV 106.3* 104.8*  PLT 209 216   No results found for: TSH Lab Results  Component Value Date   HGBA1C 5.9 (H) 08/06/2023   Lab Results  Component Value Date   CHOL 143 08/06/2023   HDL 56 08/06/2023   LDLCALC 72 08/06/2023   TRIG 69 08/06/2023   CHOLHDL 2.6 08/06/2023    Significant Diagnostic Results in last 30 days:  No results found.  Assessment/Plan  1. Edema of both lower extremities due to peripheral venous  insufficiency (Primary) -  has BLE 2+edema -   will start Lasix 20 mg give 0.5 tab = 10 mg daily X 5 days -  BMP on 08/15/24 -  continue compression socks -  continue to elevate legs at night  2. Age-related osteoporosis without current pathological fracture -  continue Risedronate  150 mg monthly -  continue Caltrate for calcium supplementation  3. Pure hypercholesterolemia Lab Results  Component Value Date   CHOL 143 08/06/2023   HDL 56 08/06/2023   LDLCALC 72 08/06/2023   TRIG 69 08/06/2023   CHOLHDL 2.6 08/06/2023    -  continue Zetia  10 mg daily  4. Rosacea -  continue Metronidazole 0.75% cream daily    Family/ staff Communication: Discussed plan of care with resident and charge nurse.  Labs/tests ordered: BMP    Kaidyn Javid Medina-Vargas, DNP, MSN, FNP-BC [facility] [phone] (Monday-Friday 8:00 a.m. - 5:00 p.m.) [phone] (after hours)

## 2024-08-11 ENCOUNTER — Non-Acute Institutional Stay: Payer: Self-pay | Admitting: Internal Medicine

## 2024-08-11 ENCOUNTER — Encounter: Payer: Self-pay | Admitting: Internal Medicine

## 2024-08-11 DIAGNOSIS — R29818 Other symptoms and signs involving the nervous system: Secondary | ICD-10-CM | POA: Diagnosis not present

## 2024-08-11 DIAGNOSIS — R600 Localized edema: Secondary | ICD-10-CM | POA: Diagnosis not present

## 2024-08-11 DIAGNOSIS — E7800 Pure hypercholesterolemia, unspecified: Secondary | ICD-10-CM | POA: Diagnosis not present

## 2024-08-11 DIAGNOSIS — R4189 Other symptoms and signs involving cognitive functions and awareness: Secondary | ICD-10-CM

## 2024-08-11 DIAGNOSIS — E559 Vitamin D deficiency, unspecified: Secondary | ICD-10-CM | POA: Diagnosis not present

## 2024-08-11 NOTE — Progress Notes (Signed)
 NURSING HOME LOCATION: [facility]    ROOM NUMBER: [id number]  CODE STATUS: Not on file  PCP: Richerd Fanti, MD  This is a comprehensive admission note to this ALF performed on this date less than 30 days from date of admission. Included are preadmission medical/surgical history; reconciled medication list; family history; social history and comprehensive review of systems.  Corrections and additions to the records were documented. Comprehensive physical exam was also performed. Additionally a clinical summary was entered for each active diagnosis pertinent to this admission in the Problem List to enhance continuity of care.  HPI: She is coming to [facility] from independent living.   Past medical and surgical history: Includes vitamin D  deficiency; dyslipidemia; peripheral edema; senile osteoporosis; history of rosacea; and seasonal allergies. Surgeries and procedures include breast biopsy.  The most recent labs on record were performed 06/09/2024 and revealed CKD stage II with creatinine of 0.90 and GFR greater than 60.  Vitamin D  level was therapeutic.  There was no anemia but macrocytosis was present with an MCV of 104.8.  There is no B12 level or TSH on record.  On 08/06/2023 LDL was 72 on Zetia .  Family history: reviewed, non contributory due to advanced age.  Social history: Nondrinker; non-smoker.    Review of systems: Clinical neurocognitive deficits made validity of responses questionable.  MoCA MMSE score was 23 out of 30 suggesting mild neurocognitive deficit.She made the comment that she only smoked 1 cigarette in her entire  life and  I was reprimanded by my father. Although she has a history of rosacea; she denied any active dermatitis.  She did validate peripheral edema which has been worse over the past week.  She states that she has been sleeping with her feet elevated.  She denies any paroxysmal nocturnal dyspnea or any sleep  issues.  Otherwise the review of system was negative.  When asked questions there was a long pause before answering.  She did not seem to exhibit any word retrieval issues.  Constitutional: No fever, significant weight change, fatigue  Eyes: No redness, discharge, pain, vision change ENT/mouth: No nasal congestion, purulent discharge, earache, change in hearing, sore throat  Cardiovascular: No chest pain, palpitations, paroxysmal nocturnal dyspnea, claudication Respiratory: No cough, sputum production, hemoptysis, DOE, significant snoring, apnea Gastrointestinal: No heartburn, dysphagia, abdominal pain, nausea /vomiting, rectal bleeding, melena, change in bowels Genitourinary: No dysuria, hematuria, pyuria, incontinence, nocturia Musculoskeletal: No joint stiffness, joint swelling, weakness, pain Neurologic: No dizziness, headache, syncope, seizures, numbness, tingling Psychiatric: No significant anxiety, depression, insomnia, anorexia Endocrine: No change in hair/skin/nails, excessive thirst, excessive hunger, excessive urination  Hematologic/lymphatic: No significant bruising, lymphadenopathy, abnormal bleeding Allergy/immunology: No itchy/watery eyes, significant sneezing, urticaria, angioedema  Physical exam:  Pertinent or positive findings: Eyebrows are absent.Grade 1 systolic murmur present.Breath sounds are decreased.2+ edema present. Pedal pulses are not palpable.  Limbs are atrophic.  General appearance: no acute distress, increased work of breathing is present.   Lymphatic: No lymphadenopathy about the head, neck, axilla. Eyes: No conjunctival inflammation or lid edema is present. There is no scleral icterus. Ears:  External ear exam shows no significant lesions or deformities.   Nose:  External nasal examination shows no deformity or inflammation. Nasal mucosa are pink and moist without lesions, exudates Oral exam: Lips and gums are healthy appearing.There is no oropharyngeal  erythema or exudate. Neck:  No thyromegaly, masses, tenderness noted.    Heart:  Normal rate and regular rhythm. S1  and S2 normal without gallop, click, rub.  Lungs: without wheezes, rhonchi, rales, rubs. Abdomen: Bowel sounds are normal.  Abdomen is soft and nontender with no organomegaly, hernias, masses. GU: Deferred  Extremities:  No cyanosis, clubbing. Skin: Warm & dry w/o tenting. No significant lesions or rash.  See clinical summary under each active problem in the Problem List with associated updated therapeutic plan:

## 2024-08-11 NOTE — Assessment & Plan Note (Addendum)
 Current vitamin D  level is therapeutic.  No change indicated in supplement.

## 2024-08-11 NOTE — Assessment & Plan Note (Signed)
 Check B12 level.

## 2024-08-11 NOTE — Assessment & Plan Note (Addendum)
 She will receive low-dose furosemide with BNP in 5 days.  If the BNP is abnormal; ECHO indicated. If BNP normal ,ABIs and venous Doppler studies could be performed to determine the etiology of the peripheral edema.  Sodium restriction was discussed with her.

## 2024-08-11 NOTE — Assessment & Plan Note (Addendum)
 08/06/2023 LDL was 72 on Zetia . Continue Zetia  and monitor lipids annually. TSH has been ordered.

## 2024-08-12 ENCOUNTER — Other Ambulatory Visit: Payer: Self-pay

## 2024-08-12 ENCOUNTER — Emergency Department

## 2024-08-12 ENCOUNTER — Emergency Department
Admission: EM | Admit: 2024-08-12 | Discharge: 2024-08-12 | Disposition: A | Discharge status: Home / Self Care | Attending: Emergency Medicine | Admitting: Emergency Medicine

## 2024-08-12 DIAGNOSIS — R600 Localized edema: Secondary | ICD-10-CM | POA: Diagnosis not present

## 2024-08-12 DIAGNOSIS — I6782 Cerebral ischemia: Secondary | ICD-10-CM | POA: Diagnosis not present

## 2024-08-12 DIAGNOSIS — I6523 Occlusion and stenosis of bilateral carotid arteries: Secondary | ICD-10-CM | POA: Diagnosis not present

## 2024-08-12 DIAGNOSIS — R4182 Altered mental status, unspecified: Secondary | ICD-10-CM | POA: Diagnosis not present

## 2024-08-12 DIAGNOSIS — G9389 Other specified disorders of brain: Secondary | ICD-10-CM | POA: Diagnosis not present

## 2024-08-12 DIAGNOSIS — M7989 Other specified soft tissue disorders: Secondary | ICD-10-CM | POA: Diagnosis not present

## 2024-08-12 DIAGNOSIS — R410 Disorientation, unspecified: Secondary | ICD-10-CM | POA: Insufficient documentation

## 2024-08-12 LAB — BASIC METABOLIC PANEL WITH GFR
Anion gap: 11 (ref 5–15)
BUN: 24 mg/dL — ABNORMAL HIGH (ref 8–23)
CO2: 25 mmol/L (ref 22–32)
Calcium: 9 mg/dL (ref 8.9–10.3)
Chloride: 100 mmol/L (ref 98–111)
Creatinine, Ser: 0.76 mg/dL (ref 0.44–1.00)
GFR, Estimated: >60 mL/min (ref >60)
Glucose, Bld: 95 mg/dL (ref 70–99)
Potassium: 4.1 mmol/L (ref 3.5–5.1)
Sodium: 136 mmol/L (ref 135–145)

## 2024-08-12 LAB — BRAIN NATRIURETIC PEPTIDE: B Natriuretic Peptide: 21 pg/mL (ref 0.0–100.0)

## 2024-08-12 LAB — CBC
HCT: 34.5 % — ABNORMAL LOW (ref 36.0–46.0)
Hemoglobin: 11.1 g/dL — ABNORMAL LOW (ref 12.0–15.0)
MCH: 33.8 pg (ref 26.0–34.0)
MCHC: 32.2 g/dL (ref 30.0–36.0)
MCV: 105.2 fL — ABNORMAL HIGH (ref 80.0–100.0)
Platelets: 246 K/uL (ref 150–400)
RBC: 3.28 MIL/uL — ABNORMAL LOW (ref 3.87–5.11)
RDW: 13.4 % (ref 11.5–15.5)
WBC: 7 K/uL (ref 4.0–10.5)
nRBC: 0 % (ref 0.0–0.2)

## 2024-08-12 LAB — URINALYSIS, ROUTINE W REFLEX MICROSCOPIC
Bacteria, UA: NONE SEEN
Bilirubin Urine: NEGATIVE
Glucose, UA: NEGATIVE mg/dL
Hgb urine dipstick: NEGATIVE
Ketones, ur: NEGATIVE mg/dL
Nitrite: NEGATIVE
Protein, ur: NEGATIVE mg/dL
Specific Gravity, Urine: 1.003 — ABNORMAL LOW (ref 1.005–1.030)
pH: 7 (ref 5.0–8.0)

## 2024-08-12 NOTE — Discharge Instructions (Signed)
 You were seen in the emergency department for transient confusion and leg edema.  Intake and follow-up with your primary care physician please consider wearing compression stockings and elevating your feet when not in use.  Talk to your primary care physician regarding cognitive tests for potential of dementia.  Return with any acutely worsening symptoms or any other emergency. It was very nice meeting you and I wish you the best of luck. -- RETURN PRECAUTIONS & AFTERCARE: (ENGLISH) RETURN PRECAUTIONS: Return immediately to the emergency department or see/call your doctor if you feel worse, weak or have changes in speech or vision, are short of breath, have fever, vomiting, pain, bleeding or dark stool, trouble urinating or any new issues. Return here or see/call your doctor if not improving as expected for your suspected condition. FOLLOW-UP CARE: Call your doctor and/or any doctors we referred you to for more advice and to make an appointment. Do this today, tomorrow or after the weekend. Some doctors only take PPO insurance so if you have HMO insurance you may want to contact your HMO or your regular doctor for referral to a specialist within your plan. Either way tell the doctor's office that it was a referral from the emergency department so you get the soonest possible appointment.  YOUR TEST RESULTS: Take result reports of any blood or urine tests, imaging tests and EKG's to your doctor and any referral doctor. Have any abnormal tests repeated. Your doctor or a referral doctor can let you know when this should be done. Also make sure your doctor contacts this hospital to get any test results that are not currently available such as cultures or special tests for infection and final imaging reports, which are often not available at the time you leave the ER but which may list additional important findings that are not documented on the preliminary report. BLOOD PRESSURE: If your blood pressure was greater  than 120/80 have your blood pressure rechecked within 1 to 2 weeks. MEDICATION SIDE EFFECTS: Do not drive, walk, bike, take the bus, etc. if you have received or are being prescribed any sedating medications such as those for pain or anxiety or certain antihistamines like Benadryl. If you have been give one of these here get a taxi home or have a friend drive you home. Ask your pharmacist to counsel you on potential side effects of any new medication

## 2024-08-12 NOTE — ED Triage Notes (Signed)
 Pt to ED for bilateral leg swelling for over 2 weeks. Staff reports pt was confused last week. Pt oriented to person and place.

## 2024-08-12 NOTE — ED Provider Notes (Signed)
 Clinical Course as of 08/12/24 1603  Fri Aug 12, 2024  1504 Received signout. Coming in with mild confusion, perhaps early dementia. Some leg swelling. F/u head CT. Hpefully dc [HD]  1554 CT Head Wo Contrast CT head unremarkable [HD]  1603 Patient reassessed and employee of [employer] at bedside.  Patient alert and oriented x 3 denies any symptoms currently.  I expressed my concerns that this may be the start of early dementia and she will follow-up with her primary care physician for cognitive testing.  She feels comfortable returning home to her new assisted living facility.  All questions answered and patient voiced understanding [HD]    Clinical Course User Index [HD] Nicholaus Rolland BRAVO, MD   At time of discharge there is no evidence of acute life, limb, vision, or fertility threat. Patient has stable vital signs, pain is well controlled, patient is ambulatory and p.o. tolerant.  Discharge instructions were completed using the EPIC system. I would refer you to those at this time. All warnings prescriptions follow-up etc. were discussed in detail with the patient. Patient indicates understanding and is agreeable with this plan. All questions answered.  Patient is made aware that they may return to the emergency department for any worsening or new condition or for any other emergency.    Nicholaus Rolland BRAVO, MD 08/12/24 [phone]

## 2024-08-12 NOTE — ED Provider Notes (Signed)
 [facility] Provider Note    Event Date/Time   First MD Initiated Contact with Patient 08/12/24 1238     (approximate)   History   Leg Swelling   HPI  Shelby Cruz is a 89 year old female presenting to the emergency department for evaluation of leg swelling and confusion.  Per report patient has had leg swelling over the past few weeks and has been more confused than usual.  Received collateral from staff worker at [facility].  He notes the patient recently transitioned to assisted living.  Shortly prior to this, patient seemed to be developing memory issues.  Specifically she got lost when driving short distance, has required redirection multiple times in their facility.  No recent falls or head trauma.  In the setting of this, patient was sent to the ER.  I do see that patient saw her primary care doctor on 06/30/2024, noted to have edema at that time that was thought to be related to dependent edema, supportive care measures discussed.     Physical Exam   Triage Vital Signs: ED Triage Vitals  Encounter Vitals Group     BP 08/12/24 1204 129/67     Girls Systolic BP Percentile --      Girls Diastolic BP Percentile --      Boys Systolic BP Percentile --      Boys Diastolic BP Percentile --      Pulse Rate 08/12/24 1204 82     Resp 08/12/24 1204 18     Temp 08/12/24 1204 98.4 F (36.9 C)     Temp src --      SpO2 08/12/24 1204 100 %     Weight 08/12/24 1205 114 lb (51.7 kg)     Height 08/12/24 1205 5' 4 (1.626 m)     Head Circumference --      Peak Flow --      Pain Score 08/12/24 1205 0     Pain Loc --      Pain Education --      Exclude from Growth Chart --     Most recent vital signs: Vitals:   08/12/24 1204  BP: 129/67  Pulse: 82  Resp: 18  Temp: 98.4 F (36.9 C)  SpO2: 100%     General: Awake, interactive  CV:  Good peripheral perfusion Resp:  Unlabored respirations Abd:  Nondistended.  Neuro:  Symmetric facial  movement, fluid speech, 5 out of 5 strength of bilateral upper and lower extremities.  Oriented to self, place, situation. Other:   Bilateral pitting edema of the lower extremities, intact distal pulses, symmetric   ED Results / Procedures / Treatments   Labs (all labs ordered are listed, but only abnormal results are displayed) Labs Reviewed  CBC - Abnormal; Notable for the following components:      Result Value   RBC 3.28 (*)    Hemoglobin 11.1 (*)    HCT 34.5 (*)    MCV 105.2 (*)    All other components within normal limits  BASIC METABOLIC PANEL WITH GFR - Abnormal; Notable for the following components:   BUN 24 (*)    All other components within normal limits  URINALYSIS, ROUTINE W REFLEX MICROSCOPIC - Abnormal; Notable for the following components:   Color, Urine COLORLESS (*)    APPearance CLEAR (*)    Specific Gravity, Urine 1.003 (*)    Leukocytes,Ua TRACE (*)    All other components within normal limits  BRAIN NATRIURETIC  PEPTIDE     EKG EKG independently reviewed and interpreted by myself demonstrates:    RADIOLOGY Imaging independently reviewed and interpreted by myself demonstrates:  CXR without focal consolidation   PROCEDURES:  Critical Care performed: No  Procedures   MEDICATIONS ORDERED IN ED: Medications - No data to display   IMPRESSION / MDM / ASSESSMENT AND PLAN / ED COURSE  I reviewed the triage vital signs and the nursing notes.  Differential diagnosis includes, but is not limited to, dependent edema, CHF, anemia, electrolyte abnormality, UTI, intracranial bleed  Patient's presentation is most consistent with acute presentation with potential threat to life or bodily function.  89 year old female presenting to the emergency department for evaluation of leg swelling, noted at primary care office a few months ago, reported increased confusion over the past few days.  Stable vitals on presentation.  CBC and BMP without critical  derangements.  Normal BNP.  Urine without evidence of infection.  X-Tiburcio Linder unremarkable.  After obtaining additional history from employee at [facility], CT head  was ordered to evaluate for intracranial cause of her subacute confusion.  Very low suspicion of CVA in the absence of focal deficits.  Signed out to oncoming physician at 1500 pending CT and disposition.  Clinical Course as of 08/12/24 1605  Fri Aug 12, 2024  1504 Received signout. Coming in with mild confusion, perhaps early dementia. Some leg swelling. F/u head CT. Hpefully dc [HD]  1554 CT Head Wo Contrast CT head unremarkable [HD]  1603 Patient reassessed and employee of [employer] at bedside.  Patient alert and oriented x 3 denies any symptoms currently.  I expressed my concerns that this may be the start of early dementia and she will follow-up with her primary care physician for cognitive testing.  She feels comfortable returning home to her new assisted living facility.  All questions answered and patient voiced understanding [HD]    Clinical Course User Index [HD] Nicholaus Rolland BRAVO, MD     FINAL CLINICAL IMPRESSION(S) / ED DIAGNOSES   Final diagnoses:  Transient confusion  Leg edema     Rx / DC Orders   ED Discharge Orders     None        Note:  This document was prepared using Dragon voice recognition software and may include unintentional dictation errors.   Levander Slate, MD 08/12/24 [phone]

## 2024-08-15 DIAGNOSIS — D519 Vitamin B12 deficiency anemia, unspecified: Secondary | ICD-10-CM | POA: Diagnosis not present

## 2024-08-15 DIAGNOSIS — R600 Localized edema: Secondary | ICD-10-CM | POA: Diagnosis not present

## 2024-08-15 DIAGNOSIS — I872 Venous insufficiency (chronic) (peripheral): Secondary | ICD-10-CM | POA: Diagnosis not present

## 2024-08-15 LAB — COMPREHENSIVE METABOLIC PANEL WITH GFR
Calcium: 9 (ref 8.7–10.7)
eGFR: 62

## 2024-08-15 LAB — BASIC METABOLIC PANEL WITH GFR
BUN: 19 (ref 4–21)
CO2: 32 — AB (ref 13–22)
Chloride: 106 (ref 99–108)
Creatinine: 0.9 (ref 0.5–1.1)
Glucose: 80
Potassium: 4.4 meq/L (ref 3.5–5.1)
Sodium: 142 (ref 137–147)

## 2024-08-15 LAB — VITAMIN B12: Vitamin B-12: 196

## 2024-08-15 LAB — TSH: TSH: 2.95 (ref 0.41–5.90)

## 2024-08-16 DIAGNOSIS — R600 Localized edema: Secondary | ICD-10-CM | POA: Diagnosis not present

## 2024-08-16 DIAGNOSIS — M6281 Muscle weakness (generalized): Secondary | ICD-10-CM | POA: Diagnosis not present

## 2024-08-16 DIAGNOSIS — E878 Other disorders of electrolyte and fluid balance, not elsewhere classified: Secondary | ICD-10-CM | POA: Diagnosis not present

## 2024-08-16 DIAGNOSIS — M8000XD Age-related osteoporosis with current pathological fracture, unspecified site, subsequent encounter for fracture with routine healing: Secondary | ICD-10-CM | POA: Diagnosis not present

## 2024-08-16 DIAGNOSIS — R2681 Unsteadiness on feet: Secondary | ICD-10-CM | POA: Diagnosis not present

## 2024-08-16 DIAGNOSIS — R278 Other lack of coordination: Secondary | ICD-10-CM | POA: Diagnosis not present

## 2024-08-16 DIAGNOSIS — E7800 Pure hypercholesterolemia, unspecified: Secondary | ICD-10-CM | POA: Diagnosis not present

## 2024-08-16 DIAGNOSIS — R2689 Other abnormalities of gait and mobility: Secondary | ICD-10-CM | POA: Diagnosis not present

## 2024-08-16 DIAGNOSIS — R7303 Prediabetes: Secondary | ICD-10-CM | POA: Diagnosis not present

## 2024-08-18 DIAGNOSIS — I872 Venous insufficiency (chronic) (peripheral): Secondary | ICD-10-CM | POA: Diagnosis not present

## 2024-08-23 DIAGNOSIS — R2681 Unsteadiness on feet: Secondary | ICD-10-CM | POA: Diagnosis not present

## 2024-08-24 DIAGNOSIS — E7800 Pure hypercholesterolemia, unspecified: Secondary | ICD-10-CM | POA: Diagnosis not present

## 2024-08-24 DIAGNOSIS — R600 Localized edema: Secondary | ICD-10-CM | POA: Diagnosis not present

## 2024-08-24 DIAGNOSIS — R7303 Prediabetes: Secondary | ICD-10-CM | POA: Diagnosis not present

## 2024-08-24 DIAGNOSIS — I872 Venous insufficiency (chronic) (peripheral): Secondary | ICD-10-CM | POA: Diagnosis not present

## 2024-08-24 DIAGNOSIS — R2681 Unsteadiness on feet: Secondary | ICD-10-CM | POA: Diagnosis not present

## 2024-08-24 DIAGNOSIS — M6281 Muscle weakness (generalized): Secondary | ICD-10-CM | POA: Diagnosis not present

## 2024-08-24 DIAGNOSIS — R278 Other lack of coordination: Secondary | ICD-10-CM | POA: Diagnosis not present

## 2024-08-24 DIAGNOSIS — M8000XD Age-related osteoporosis with current pathological fracture, unspecified site, subsequent encounter for fracture with routine healing: Secondary | ICD-10-CM | POA: Diagnosis not present

## 2024-08-24 DIAGNOSIS — R2689 Other abnormalities of gait and mobility: Secondary | ICD-10-CM | POA: Diagnosis not present

## 2024-08-25 DIAGNOSIS — R2681 Unsteadiness on feet: Secondary | ICD-10-CM | POA: Diagnosis not present

## 2024-08-25 DIAGNOSIS — R2689 Other abnormalities of gait and mobility: Secondary | ICD-10-CM | POA: Diagnosis not present

## 2024-08-25 DIAGNOSIS — E7800 Pure hypercholesterolemia, unspecified: Secondary | ICD-10-CM | POA: Diagnosis not present

## 2024-08-25 DIAGNOSIS — I872 Venous insufficiency (chronic) (peripheral): Secondary | ICD-10-CM | POA: Diagnosis not present

## 2024-08-25 DIAGNOSIS — M8000XD Age-related osteoporosis with current pathological fracture, unspecified site, subsequent encounter for fracture with routine healing: Secondary | ICD-10-CM | POA: Diagnosis not present

## 2024-08-25 DIAGNOSIS — R7303 Prediabetes: Secondary | ICD-10-CM | POA: Diagnosis not present

## 2024-08-25 DIAGNOSIS — R600 Localized edema: Secondary | ICD-10-CM | POA: Diagnosis not present

## 2024-08-25 DIAGNOSIS — R278 Other lack of coordination: Secondary | ICD-10-CM | POA: Diagnosis not present

## 2024-08-25 DIAGNOSIS — M6281 Muscle weakness (generalized): Secondary | ICD-10-CM | POA: Diagnosis not present

## 2024-08-26 ENCOUNTER — Other Ambulatory Visit: Payer: Self-pay | Admitting: Student

## 2024-08-26 MED ORDER — EZETIMIBE 10 MG PO TABS: 10.0000 mg | ORAL_TABLET | Freq: Every day | ORAL | 0 refills | Status: AC

## 2024-08-26 NOTE — Telephone Encounter (Signed)
 Copied from CRM [id number]. Topic: Clinical - Medication Refill >> Aug 26, 2024  1:20 PM Carrielelia G wrote: Pharmacy calling refill in   Medication: ezetimibe  (ZETIA ) 10 MG tablet  Has the patient contacted their pharmacy? Yes (Agent: If no, request that the patient contact the pharmacy for the refill. If patient does not wish to contact the pharmacy document the reason why and proceed with request.) (Agent: If yes, when and what did the pharmacy advise?)  This is the patient's preferred pharmacy:  [id number] - [place], [place] - [address] AT [facility] OF [address] [place] [address] Phone: [phone] Fax: [phone]  Is this the correct pharmacy for this prescription? Yes If no, delete pharmacy and type the correct one.

## 2024-08-26 NOTE — Telephone Encounter (Signed)
 Last lipid check was in 08/2023 please advise

## 2024-08-30 ENCOUNTER — Encounter: Payer: Self-pay | Admitting: Nurse Practitioner

## 2024-08-30 ENCOUNTER — Non-Acute Institutional Stay: Payer: Self-pay | Admitting: Nurse Practitioner

## 2024-08-30 DIAGNOSIS — I872 Venous insufficiency (chronic) (peripheral): Secondary | ICD-10-CM | POA: Diagnosis not present

## 2024-08-30 DIAGNOSIS — E559 Vitamin D deficiency, unspecified: Secondary | ICD-10-CM | POA: Diagnosis not present

## 2024-08-30 DIAGNOSIS — M6281 Muscle weakness (generalized): Secondary | ICD-10-CM | POA: Diagnosis not present

## 2024-08-30 DIAGNOSIS — E7800 Pure hypercholesterolemia, unspecified: Secondary | ICD-10-CM | POA: Diagnosis not present

## 2024-08-30 DIAGNOSIS — M810 Age-related osteoporosis without current pathological fracture: Secondary | ICD-10-CM | POA: Diagnosis not present

## 2024-08-30 DIAGNOSIS — F02A4 Dementia in other diseases classified elsewhere, mild, with anxiety: Secondary | ICD-10-CM

## 2024-08-30 DIAGNOSIS — R7303 Prediabetes: Secondary | ICD-10-CM | POA: Diagnosis not present

## 2024-08-30 DIAGNOSIS — R2681 Unsteadiness on feet: Secondary | ICD-10-CM | POA: Diagnosis not present

## 2024-08-30 DIAGNOSIS — R2689 Other abnormalities of gait and mobility: Secondary | ICD-10-CM | POA: Diagnosis not present

## 2024-08-30 DIAGNOSIS — E538 Deficiency of other specified B group vitamins: Secondary | ICD-10-CM | POA: Diagnosis not present

## 2024-08-30 DIAGNOSIS — G301 Alzheimer's disease with late onset: Secondary | ICD-10-CM

## 2024-08-30 DIAGNOSIS — R278 Other lack of coordination: Secondary | ICD-10-CM | POA: Diagnosis not present

## 2024-08-30 DIAGNOSIS — M8000XD Age-related osteoporosis with current pathological fracture, unspecified site, subsequent encounter for fracture with routine healing: Secondary | ICD-10-CM | POA: Diagnosis not present

## 2024-08-30 DIAGNOSIS — R600 Localized edema: Secondary | ICD-10-CM | POA: Diagnosis not present

## 2024-08-30 NOTE — Progress Notes (Signed)
 Location:  Other Nursing Home Room Number: [name] Place of Service:  ALF (13)  Caro Harlene POUR, NP  Patient Care Team: Caro Harlene POUR, NP as PCP - General (Geriatric Medicine) [name], MD (Ophthalmology) Camelia Lukes, [facility] (Dermatology)  Extended Emergency Contact Information Primary Emergency Contact: Devereus,Mary Mobile Phone: [phone] Relation: Daughter  Goals of care: Advanced Directive information    08/12/2024   12:08 PM  Advanced Directives  Does Patient Have a Medical Advance Directive? Yes     Chief Complaint  Patient presents with   Medical Management of Chronic Issues    Medical Management of Chronic Issues.     HPI:  Pt is a 89 y.o. female seen today for medical management of chronic disease.   The patient has been experiencing confusion and disorientation since moving into assisted living about a month ago. She describes an incident where she mistook the time of day, thinking it was evening when it was actually morning. She attributes some of her confusion to the disorientation caused by moving between different living environments. She feels anxious and overwhelmed by these episodes of confusion, which have been observed by others. A recent visit to the emergency department was due to increased confusion and leg swelling. She is concerned about the lack of communication with her family, who are on a trip, contributing to her anxiety.  She reports significant leg swelling, particularly in the right leg, which she describes as 'the worst one of the two.' She has been using compression hose to manage the swelling. She is concerned about the practicality of elevating her legs due to the placement of her telephone, which she fears might cause her to trip. She is also concerned about the need to sleep with her head lower than her feet to manage swelling, although she is unsure if this has been effective.  She is currently on  Actonel  for osteoporosis, which she has been taking for less than two years.  Has been Zetia  for hyperlipidemia.   She also has a history of vitamin D  deficiency and on supplement  No shortness of breath, cough, or congestion. No chest pains.  No trouble with bowels.  No abnormal bruising or bleeding reported   History reviewed. No pertinent past medical history. Past Surgical History:  Procedure Laterality Date   BREAST BIOPSY Right 1963   negative    Allergies  Allergen Reactions   Bee Pollen    Molds & Smuts     Outpatient Encounter Medications as of 08/30/2024  Medication Sig   acetaminophen (TYLENOL) 500 MG tablet Take 500 mg by mouth every 6 (six) hours as needed.   aluminum-magnesium hydroxide 200-200 MG/5ML suspension Take 15 mLs by mouth every 4 (four) hours as needed for indigestion.   Biotin 5000 MCG CAPS Take 1 capsule by mouth daily.   bismuth subsalicylate (PEPTO BISMOL) 262 MG/15ML suspension Take 30 mLs by mouth as needed.   Calcium Carb-Cholecalciferol (CALTRATE 600+D3 SOFT) 600-20 MG-MCG CHEW Chew 1 capsule by mouth daily.   carbamide peroxide (DEBROX) 6.5 % OTIC solution 5 drops as needed.   cetirizine (ZYRTEC) 10 MG tablet Take 10 mg by mouth daily. Give one tablet by mouth as needed (Patient taking differently: Take 5 mg by mouth daily as needed.)   Cholecalciferol (VITAMIN D3) 50 MCG (2000 UT) capsule Take 2,000 Units by mouth daily.   dextromethorphan-guaiFENesin (ROBITUSSIN-DM) 10-100 MG/5ML liquid Take 10 mLs by mouth every 4 (four) hours as needed for cough.  ezetimibe  (ZETIA ) 10 MG tablet Take 1 tablet (10 mg total) by mouth daily.   Glucose 15 GM/32ML GEL Take 1 packet by mouth as needed.   loratadine (CLARITIN) 10 MG tablet Take 10 mg by mouth daily as needed for allergies.   magnesium hydroxide (MILK OF MAGNESIA) 400 MG/5ML suspension Take 30 mLs by mouth daily as needed for mild constipation.   metroNIDAZOLE (METROCREAM) 0.75 % cream Apply 1  Application topically 2 (two) times daily.   nystatin (MYCOSTATIN/NYSTOP) powder Apply 1 Application topically 2 (two) times daily.   ondansetron  (ZOFRAN ) 4 MG tablet Take 4 mg by mouth every 8 (eight) hours as needed for nausea or vomiting.   OXYGEN Inhale into the lungs. 2lpm   risedronate  (ACTONEL ) 150 MG tablet TAKE 1 TABLET BY MOUTH EVERY 30 DAYS WITH WATER ON EMPTY STOMACH. NOTHING BY MOUTH OR LIE DOWN FOR NEXT 30 MINUTES AS DIRECTED   triamcinolone  cream (KENALOG ) 0.1 % Apply 1 Application topically 2 (two) times daily.   mupirocin  ointment (BACTROBAN ) 2 % Apply 1 Application topically 2 (two) times daily. (Patient not taking: Reported on 08/30/2024)   No facility-administered encounter medications on file as of 08/30/2024.    Review of Systems  Constitutional:  Negative for activity change, appetite change, fatigue and unexpected weight change.  HENT:  Negative for congestion and hearing loss.   Eyes: Negative.   Respiratory:  Negative for cough and shortness of breath.   Cardiovascular:  Negative for chest pain, palpitations and leg swelling.  Gastrointestinal:  Negative for abdominal pain, constipation and diarrhea.  Genitourinary:  Negative for difficulty urinating and dysuria.  Musculoskeletal:  Negative for arthralgias and myalgias.  Skin:  Negative for color change and wound.  Neurological:  Negative for dizziness and weakness.  Psychiatric/Behavioral:  Positive for confusion. Negative for agitation and behavioral problems. The patient is nervous/anxious.      Immunization History  Administered Date(s) Administered   Influenza-Unspecified 08/24/2022, 08/27/2023   Moderna Covid-19 Fall Seasonal Vaccine 57yrs & older 02/10/2023   Moderna Covid-19 Vaccine Bivalent Booster 39yrs & up 07/25/2021, 04/01/2022, 09/12/2022   Moderna Sars-Covid-2 Vaccination 11/18/2019, 12/16/2019, 09/18/2020, 03/21/2021, 07/31/2023   Pneumococcal Conjugate-13 01/12/2014   Pneumococcal  Polysaccharide-23 10/03/1999, 11/05/1999   Tdap 07/04/2010, 06/10/2023   Unspecified SARS-COV-2 Vaccination 02/12/2024   Zoster Recombinant(Shingrix) 02/24/2008, 06/10/2023, 09/10/2023   Zoster, Live 02/24/2008   Pertinent  Health Maintenance Due  Topic Date Due   Influenza Vaccine  06/03/2024   DEXA SCAN  Completed      06/05/2023    6:37 PM 08/12/2023    1:07 PM 09/04/2023    2:23 PM 06/15/2024    1:08 PM 06/30/2024   10:22 AM  Fall Risk  Falls in the past year? 1 0 0 0 0  Was there an injury with Fall? 0 0 0 0 0  Fall Risk Category Calculator 1 0 0 0 0  Patient at Risk for Falls Due to    No Fall Risks Impaired balance/gait;No Fall Risks  Fall risk Follow up    Falls evaluation completed Falls evaluation completed   Functional Status Survey:    Vitals:   08/30/24 1432  BP: (!) 111/58  Pulse: 80  Resp: 18  Temp: 97.8 F (36.6 C)  SpO2: 98%  Weight: 114 lb (51.7 kg)  Height: 5' 4 (1.626 m)   Body mass index is 19.57 kg/m. Physical Exam Constitutional:      General: She is not in acute distress.    Appearance:  She is well-developed. She is not diaphoretic.  HENT:     Head: Normocephalic and atraumatic.     Mouth/Throat:     Pharynx: No oropharyngeal exudate.  Eyes:     Conjunctiva/sclera: Conjunctivae normal.     Pupils: Pupils are equal, round, and reactive to light.  Cardiovascular:     Rate and Rhythm: Normal rate and regular rhythm.     Heart sounds: Normal heart sounds.  Pulmonary:     Effort: Pulmonary effort is normal.     Breath sounds: Normal breath sounds.  Abdominal:     General: Bowel sounds are normal.     Palpations: Abdomen is soft.  Musculoskeletal:        General: No tenderness.     Cervical back: Normal range of motion and neck supple.     Right lower leg: Edema present.     Left lower leg: Edema present.  Skin:    General: Skin is warm and dry.  Neurological:     Mental Status: She is alert and oriented to person, place, and time.      Labs reviewed: Recent Labs    01/18/24 0914 06/09/24 0810 08/12/24 1210 08/15/24 0000  NA 142 141 136 142  K 3.6 3.6 4.1 4.4  CL 107 104 100 106  CO2 23 29 25  32*  GLUCOSE 145* 93 95  --   BUN 18 22 24* 19  CREATININE 0.87 0.90 0.76 0.9  CALCIUM 8.9 9.3 9.0 9.0   Recent Labs    01/18/24 0914 06/09/24 0810  AST 28 22  ALT 15 9  ALKPHOS 42  --   BILITOT 0.5 0.6  PROT 7.1 7.1  ALBUMIN 4.0  --    Recent Labs    01/18/24 0914 06/09/24 0810 08/12/24 1210  WBC 5.5 7.2 7.0  NEUTROABS 3.4 2,066  --   HGB 13.3 13.0 11.1*  HCT 40.7 39.6 34.5*  MCV 106.3* 104.8* 105.2*  PLT 209 216 246   Lab Results  Component Value Date   TSH 2.95 08/15/2024   Lab Results  Component Value Date   HGBA1C 5.9 (H) 08/06/2023   Lab Results  Component Value Date   CHOL 143 08/06/2023   HDL 56 08/06/2023   LDLCALC 72 08/06/2023   TRIG 69 08/06/2023   CHOLHDL 2.6 08/06/2023    Significant Diagnostic Results in last 30 days:  CT Head Wo Contrast Result Date: 08/12/2024 CLINICAL DATA:  Altered mental status. EXAM: CT HEAD WITHOUT CONTRAST TECHNIQUE: Contiguous axial images were obtained from the base of the skull through the vertex without intravenous contrast. RADIATION DOSE REDUCTION: This exam was performed according to the departmental dose-optimization program which includes automated exposure control, adjustment of the mA and/or kV according to patient size and/or use of iterative reconstruction technique. COMPARISON:  None Available. FINDINGS: Brain: There is generalized cerebral atrophy with widening of the extra-axial spaces and ventricular dilatation. There are areas of decreased attenuation within the white matter tracts of the supratentorial brain, consistent with microvascular disease changes. Normal basal ganglia. Normal bilateral thalami. Vascular: Marked severity bilateral cavernous carotid artery calcification is noted. Skull: Normal. Negative for fracture or focal  lesion. Sinuses/Orbits: No acute finding. Other: None. IMPRESSION: 1. Generalized cerebral atrophy with chronic white matter small vessel ischemic changes. 2. No acute intracranial abnormality. Electronically Signed   By: Suzen Dials M.D.   On: 08/12/2024 15:50   DG Chest 2 View Result Date: 08/12/2024 EXAM: 2 VIEW(S) XRAY OF THE CHEST  08/12/2024 01:15:00 PM COMPARISON: None available. CLINICAL HISTORY: Leg swelling. Pt to ED for bilateral leg swelling for over 2 weeks. FINDINGS: LUNGS AND PLEURA: No focal pulmonary opacity. No pulmonary edema. No pleural effusion. No pneumothorax. HEART AND MEDIASTINUM: No acute abnormality of the cardiac and mediastinal silhouettes. BONES AND SOFT TISSUES: No acute osseous abnormality. IMPRESSION: 1. No acute process. Electronically signed by: Lynwood Seip MD 08/12/2024 01:36 PM EDT RP Workstation: [id number]    Assessment/Plan 1. 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 Continue supportive care with staff and family May need additional medication due to anxiety  2. 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  -encouraged to elevate legs above level of heart as tolerates, low sodium diet, compression hose as tolerates (on in am, off in pm)  3. Age-related osteoporosis without current pathological fracture Osteoporosis managed with Actonel  since 2024. - Continue Actonel . With cal and vit D  4. Pure hypercholesterolemia Hyperlipidemia managed with Zetia . Cholesterol levels were last checked last year. -follow up lipids with next labs .  Vitamin B12 deficiency Low vitamin B12 level contributing to confusion. - Start vitamin B12  supplement. -b12 1000 mcg daily   Tida Saner K. Caro BODILY [facility] [phone]

## 2024-09-07 ENCOUNTER — Other Ambulatory Visit: Payer: Self-pay

## 2024-09-07 DIAGNOSIS — M810 Age-related osteoporosis without current pathological fracture: Secondary | ICD-10-CM

## 2024-09-07 MED ORDER — RISEDRONATE SODIUM 150 MG PO TABS: 150.0000 mg | ORAL_TABLET | ORAL | 0 refills | Status: AC

## 2024-09-12 ENCOUNTER — Non-Acute Institutional Stay: Payer: Self-pay | Admitting: Orthopedic Surgery

## 2024-09-12 ENCOUNTER — Encounter: Payer: Self-pay | Admitting: Orthopedic Surgery

## 2024-09-12 DIAGNOSIS — R600 Localized edema: Secondary | ICD-10-CM

## 2024-09-12 NOTE — Progress Notes (Unsigned)
 Location:  Other [place].  Nursing Home Room Number: [name] Place of Service:  ALF (13) Provider:  Greig Cluster, NP  PCP: Caro Harlene POUR, NP  Patient Care Team: Caro Harlene POUR, NP as PCP - General (Geriatric Medicine) [name], MD (Ophthalmology) Camelia Lukes, [facility] (Dermatology)  Extended Emergency Contact Information Primary Emergency Contact: Devereus,Mary Mobile Phone: [phone] Relation: Daughter  Code Status:  Full Goals of care: Advanced Directive information    09/12/2024   12:20 PM  Advanced Directives  Does Patient Have a Medical Advance Directive? Yes  Type of Advance Directive Healthcare Power of Attorney  Does patient want to make changes to medical advance directive? No - Patient declined  Copy of Healthcare Power of Attorney in Chart? Yes - validated most recent copy scanned in chart (See row information)     Chief Complaint  Patient presents with   Leg weeping    Bilateral Leg Weeping.     HPI:  Shelby Cruz is a 89 y.o. female seen today for acute visit due to bilateral leg sweeping.   She currently resides on the assisted living unit at [facility]. PMH: neurocognitive disorder, osteoporosis, prediabetes, HLD, vitamin D  deficiency and lower leg edema.   Increased bilateral lower leg weeping x 2 days. Nursing staff has been wrapping legs. She is also elevating legs during the day. Recent BNP 21 08/12/2024. Afebrile. Vitals stable.    History reviewed. No pertinent past medical history. Past Surgical History:  Procedure Laterality Date   BREAST BIOPSY Right 1963   negative    Allergies  Allergen Reactions   Bee Pollen    Molds & Smuts     Outpatient Encounter Medications as of 09/12/2024  Medication Sig   acetaminophen (TYLENOL) 500 MG tablet Take 500 mg by mouth every 6 (six) hours as needed.   aluminum-magnesium hydroxide 200-200 MG/5ML suspension Take 15 mLs by mouth every 4 (four) hours as needed for  indigestion.   Biotin 5000 MCG CAPS Take 1 capsule by mouth daily.   bismuth subsalicylate (PEPTO BISMOL) 262 MG/15ML suspension Take 30 mLs by mouth as needed.   Calcium Carb-Cholecalciferol (CALTRATE 600+D3 SOFT) 600-20 MG-MCG CHEW Chew 1 capsule by mouth daily.   carbamide peroxide (DEBROX) 6.5 % OTIC solution 5 drops as needed.   cetirizine (ZYRTEC) 10 MG tablet Take 10 mg by mouth daily. Give 1/2 tablet by mouth as needed at bedtime.   Cholecalciferol (VITAMIN D3) 50 MCG (2000 UT) capsule Take 2,000 Units by mouth daily.   cyanocobalamin (VITAMIN B12) 1000 MCG tablet Take 1,000 mcg by mouth daily.   dextromethorphan-guaiFENesin (ROBITUSSIN-DM) 10-100 MG/5ML liquid Take 10 mLs by mouth every 4 (four) hours as needed for cough.   ezetimibe  (ZETIA ) 10 MG tablet Take 1 tablet (10 mg total) by mouth daily.   Glucose 15 GM/32ML GEL Take 1 packet by mouth as needed.   loratadine (CLARITIN) 10 MG tablet Take 10 mg by mouth daily as needed for allergies.   magnesium hydroxide (MILK OF MAGNESIA) 400 MG/5ML suspension Take 30 mLs by mouth daily as needed for mild constipation.   memantine (NAMENDA) 10 MG tablet Take 10 mg by mouth 2 (two) times daily.   memantine (NAMENDA) 5 MG tablet Take 5 mg by mouth 2 (two) times daily.   metroNIDAZOLE (METROCREAM) 0.75 % cream Apply 1 Application topically 2 (two) times daily.   nystatin (MYCOSTATIN/NYSTOP) powder Apply 1 Application topically 2 (two) times daily.   ondansetron  (ZOFRAN ) 4 MG tablet Take  4 mg by mouth every 8 (eight) hours as needed for nausea or vomiting.   OXYGEN Inhale into the lungs. 2lpm   risedronate  (ACTONEL ) 150 MG tablet Take 1 tablet (150 mg total) by mouth every 30 (thirty) days. WITH WATER ON EMPTY STOMACH. NOTHING BY MOUTH OR LIE DOWN FOR NEXT 30 MINUTES AS DIRECTED   triamcinolone  cream (KENALOG ) 0.1 % Apply 1 Application topically 2 (two) times daily.   mupirocin  ointment (BACTROBAN ) 2 % Apply 1 Application topically 2 (two) times  daily. (Patient not taking: Reported on 09/12/2024)   No facility-administered encounter medications on file as of 09/12/2024.    Review of Systems  Constitutional: Negative.   Respiratory: Negative.  Negative for cough and shortness of breath.   Cardiovascular:  Positive for leg swelling.  Psychiatric/Behavioral:  Negative for dysphoric mood. The patient is nervous/anxious.     Immunization History  Administered Date(s) Administered   Influenza-Unspecified 08/24/2022, 08/27/2023, 08/16/2024   Moderna Covid-19 Fall Seasonal Vaccine 57yrs & older 02/10/2023   Moderna Covid-19 Vaccine Bivalent Booster 19yrs & up 07/25/2021, 04/01/2022, 09/12/2022   Moderna Sars-Covid-2 Vaccination 11/18/2019, 12/16/2019, 09/18/2020, 03/21/2021, 07/31/2023   Pneumococcal Conjugate-13 01/12/2014   Pneumococcal Polysaccharide-23 10/03/1999, 11/05/1999   Tdap 07/04/2010, 06/10/2023   Unspecified SARS-COV-2 Vaccination 02/12/2024, 08/26/2024   Zoster Recombinant(Shingrix) 02/24/2008, 06/10/2023, 09/10/2023   Zoster, Live 02/24/2008   Pertinent  Health Maintenance Due  Topic Date Due   Influenza Vaccine  Completed   DEXA SCAN  Completed      06/05/2023    6:37 PM 08/12/2023    1:07 PM 09/04/2023    2:23 PM 06/15/2024    1:08 PM 06/30/2024   10:22 AM  Fall Risk  Falls in the past year? 1 0 0 0 0  Was there an injury with Fall? 0 0 0 0 0  Fall Risk Category Calculator 1 0 0 0 0  Patient at Risk for Falls Due to    No Fall Risks Impaired balance/gait;No Fall Risks  Fall risk Follow up    Falls evaluation completed Falls evaluation completed   Functional Status Survey:    Vitals:   09/12/24 1210  BP: 138/70  Pulse: 91  Resp: 17  Temp: 97.9 F (36.6 C)  SpO2: 96%  Weight: 130 lb 6.4 oz (59.1 kg)  Height: 5' 4 (1.626 m)   Body mass index is 22.38 kg/m. Physical Exam Vitals reviewed.  Constitutional:      General: She is not in acute distress. HENT:     Head: Normocephalic.  Eyes:      General:        Right eye: No discharge.        Left eye: No discharge.  Cardiovascular:     Rate and Rhythm: Normal rate and regular rhythm.     Pulses: Normal pulses.     Heart sounds: Normal heart sounds.  Pulmonary:     Effort: Pulmonary effort is normal. No respiratory distress.     Breath sounds: Normal breath sounds. No wheezing or rales.  Musculoskeletal:     Cervical back: Neck supple.     Right lower leg: Edema present.     Left lower leg: Edema present.     Comments: BLE 2-3+ pitting, L>R in size, serous drainage present  Skin:    General: Skin is warm.     Capillary Refill: Capillary refill takes less than 2 seconds.  Neurological:     General: No focal deficit present.  Mental Status: She is alert. Mental status is at baseline.     Gait: Gait abnormal.  Psychiatric:        Mood and Affect: Mood normal.     Labs reviewed: Recent Labs    01/18/24 0914 06/09/24 0810 08/12/24 1210 08/15/24 0000  NA 142 141 136 142  K 3.6 3.6 4.1 4.4  CL 107 104 100 106  CO2 23 29 25  32*  GLUCOSE 145* 93 95  --   BUN 18 22 24* 19  CREATININE 0.87 0.90 0.76 0.9  CALCIUM 8.9 9.3 9.0 9.0   Recent Labs    01/18/24 0914 06/09/24 0810  AST 28 22  ALT 15 9  ALKPHOS 42  --   BILITOT 0.5 0.6  PROT 7.1 7.1  ALBUMIN 4.0  --    Recent Labs    01/18/24 0914 06/09/24 0810 08/12/24 1210  WBC 5.5 7.2 7.0  NEUTROABS 3.4 2,066  --   HGB 13.3 13.0 11.1*  HCT 40.7 39.6 34.5*  MCV 106.3* 104.8* 105.2*  PLT 209 216 246   Lab Results  Component Value Date   TSH 2.95 08/15/2024   Lab Results  Component Value Date   HGBA1C 5.9 (H) 08/06/2023   Lab Results  Component Value Date   CHOL 143 08/06/2023   HDL 56 08/06/2023   LDLCALC 72 08/06/2023   TRIG 69 08/06/2023   CHOLHDL 2.6 08/06/2023    Significant Diagnostic Results in last 30 days:  No results found.  Assessment/Plan 1. Lower leg edema (Primary) - ongoing, L>R - BNP was 21 08/12/2024 - BUN/creat 19/0.9  08/12/2024 - cont compression and leg elevation - start furosemide 40 mg po QAM x 2 days - start furosemide 20 mg po daily prn> give if edema 2-3+ pitting  - repeat BMP/BNP 11/13 - cont low sodium diet    Family/ staff Communication: plan discussed with patient and nurse  Labs/tests ordered:  BMP/BNP 11/13

## 2024-09-15 LAB — BASIC METABOLIC PANEL WITH GFR
BUN: 28 — AB (ref 4–21)
CO2: 30 — AB (ref 13–22)
Chloride: 101 (ref 99–108)
Creatinine: 0.9 (ref 0.5–1.1)
Glucose: 79
Potassium: 4.3 meq/L (ref 3.5–5.1)
Sodium: 141 (ref 137–147)

## 2024-09-15 LAB — COMPREHENSIVE METABOLIC PANEL WITH GFR
Calcium: 9.7 (ref 8.7–10.7)
eGFR: 58

## 2024-09-27 ENCOUNTER — Non-Acute Institutional Stay: Payer: Self-pay | Admitting: Nurse Practitioner

## 2024-09-27 ENCOUNTER — Encounter: Payer: Self-pay | Admitting: Nurse Practitioner

## 2024-09-27 DIAGNOSIS — Z0000 Encounter for general adult medical examination without abnormal findings: Secondary | ICD-10-CM

## 2024-09-27 NOTE — Progress Notes (Signed)
 Chief Complaint  Patient presents with   Medicare Wellness    AWV     Subjective:   Shelby Cruz is a 89 y.o. female who presents for a Medicare Annual Wellness Visit.  Allergies (verified) Bee pollen and Molds & smuts   History: History reviewed. No pertinent past medical history. Past Surgical History:  Procedure Laterality Date   BREAST BIOPSY Right 1963   negative   Family History  Problem Relation Age of Onset   Breast cancer Neg Hx    Social History   Occupational History   Not on file  Tobacco Use   Smoking status: Never   Smokeless tobacco: Never  Substance and Sexual Activity   Alcohol use: Never   Drug use: Never   Sexual activity: Not on file   Tobacco Counseling Counseling given: Not Answered  SDOH Screenings   Food Insecurity: No Food Insecurity (06/15/2024)  Housing: Unknown (06/15/2024)  Transportation Needs: No Transportation Needs (06/15/2024)  Utilities: Not At Risk (06/15/2024)  Depression (PHQ2-9): Low Risk  (09/27/2024)  Financial Resource Strain: Patient Declined (02/09/2023)   Received from [facility]  Tobacco Use: Low Risk  (09/27/2024)   See flowsheets for full screening details  Depression Screen PHQ 2 & 9 Depression Scale- Over the past 2 weeks, how often have you been bothered by any of the following problems? Little interest or pleasure in doing things: 0 Feeling down, depressed, or hopeless (PHQ Adolescent also includes...irritable): 0 PHQ-2 Total Score: 0     Goals Addressed   None    Visit info / Clinical Intake: Medicare Wellness Visit Type:: Subsequent Annual Wellness Visit Persons participating in visit:: patient Medicare Wellness Visit Mode:: In-person (required for WTM) Information given by:: patient Interpreter Needed?: No Pre-visit prep was completed: no AWV questionnaire completed by patient prior to visit?: no Living arrangements:: in retirement community Patient's Overall Health  Status Rating: very good Typical amount of pain: some Does pain affect daily life?: no Are you currently prescribed opioids?: no  Dietary Habits and Nutritional Risks How many meals a day?: 3 Eats fruit and vegetables daily?: yes Most meals are obtained by: having others provide food In the last 2 weeks, have you had any of the following?: none Diabetic:: no  Functional Status Activities of Daily Living (to include ambulation/medication): Independent Ambulation: Independent with device- listed below Home Assistive Devices/Equipment: Walker (specify Type) Medication Administration: Needs assistance (comment) Is this a change from baseline?: Pre-admission baseline Home Management: Needs assistance (comment) Manage your own finances?: (!) no (family help) Primary transportation is: facility / other Concerns about vision?: no *vision screening is required for WTM* Concerns about hearing?: no  Fall Screening Falls in the past year?: 0 Number of falls in past year: 0 Was there an injury with Fall?: 0 Fall Risk Category Calculator: 0 Patient Fall Risk Level: Low Fall Risk  Fall Risk Patient at Risk for Falls Due to: Impaired balance/gait; Impaired mobility Fall risk Follow up: Falls evaluation completed  Home and Transportation Safety: All rugs have non-skid backing?: N/A, no rugs All stairs or steps have railings?: yes Grab bars in the bathtub or shower?: yes Have non-skid surface in bathtub or shower?: yes Good home lighting?: yes Regular seat belt use?: yes Hospital stays in the last year:: no  Cognitive Assessment Difficulty concentrating, remembering, or making decisions? : yes  Advance Directives (For Healthcare) Does Patient Have a Medical Advance Directive?: Yes Does patient want to make changes to medical advance directive?:  No - Patient declined Type of Advance Directive: Healthcare Power of [employer] of Healthcare Power of Attorney in Chart?: Yes - validated  most recent copy scanned in chart (See row information) Would patient like information on creating a medical advance directive?: No - Patient declined  Reviewed/Updated  Reviewed/Updated: Reviewed All (Medical, Surgical, Family, Medications, Allergies, Care Teams, Patient Goals)        Objective:    Today's Vitals   09/27/24 1019  BP: 138/70  Pulse: 91  Resp: 17  Temp: 97.9 F (36.6 C)  SpO2: 96%  Weight: 130 lb 6.4 oz (59.1 kg)  Height: 5' 4 (1.626 m)   Body mass index is 22.38 kg/m.  Current Medications (verified) Outpatient Encounter Medications as of 09/27/2024  Medication Sig   acetaminophen (TYLENOL) 500 MG tablet Take 500 mg by mouth every 6 (six) hours as needed.   aluminum-magnesium hydroxide 200-200 MG/5ML suspension Take 15 mLs by mouth every 4 (four) hours as needed for indigestion.   Biotin 5000 MCG CAPS Take 1 capsule by mouth daily.   bismuth subsalicylate (PEPTO BISMOL) 262 MG/15ML suspension Take 30 mLs by mouth as needed.   Calcium Carb-Cholecalciferol (CALTRATE 600+D3 SOFT) 600-20 MG-MCG CHEW Chew 1 capsule by mouth daily.   carbamide peroxide (DEBROX) 6.5 % OTIC solution 5 drops as needed.   cetirizine (ZYRTEC) 10 MG tablet Take 10 mg by mouth daily. Give 1/2 tablet by mouth as needed at bedtime.   Cholecalciferol (VITAMIN D3) 50 MCG (2000 UT) capsule Take 2,000 Units by mouth daily.   cyanocobalamin (VITAMIN B12) 1000 MCG tablet Take 1,000 mcg by mouth daily.   dextromethorphan-guaiFENesin (ROBITUSSIN-DM) 10-100 MG/5ML liquid Take 10 mLs by mouth every 4 (four) hours as needed for cough.   ezetimibe  (ZETIA ) 10 MG tablet Take 1 tablet (10 mg total) by mouth daily.   furosemide (LASIX) 20 MG tablet Take 20 mg by mouth as needed.   Glucose 15 GM/32ML GEL Take 1 packet by mouth as needed.   loratadine (CLARITIN) 10 MG tablet Take 10 mg by mouth daily as needed for allergies.   magnesium hydroxide (MILK OF MAGNESIA) 400 MG/5ML suspension Take 30 mLs by mouth  daily as needed for mild constipation.   memantine (NAMENDA) 10 MG tablet Take 10 mg by mouth 2 (two) times daily.   metroNIDAZOLE (METROCREAM) 0.75 % cream Apply 1 Application topically 2 (two) times daily.   nystatin (MYCOSTATIN/NYSTOP) powder Apply 1 Application topically 2 (two) times daily.   ondansetron  (ZOFRAN ) 4 MG tablet Take 4 mg by mouth every 8 (eight) hours as needed for nausea or vomiting.   OXYGEN Inhale into the lungs. 2lpm   risedronate  (ACTONEL ) 150 MG tablet Take 1 tablet (150 mg total) by mouth every 30 (thirty) days. WITH WATER ON EMPTY STOMACH. NOTHING BY MOUTH OR LIE DOWN FOR NEXT 30 MINUTES AS DIRECTED   triamcinolone  cream (KENALOG ) 0.1 % Apply 1 Application topically 2 (two) times daily.   [DISCONTINUED] memantine (NAMENDA) 5 MG tablet Take 5 mg by mouth 2 (two) times daily. (Patient not taking: Reported on 09/27/2024)   [DISCONTINUED] mupirocin  ointment (BACTROBAN ) 2 % Apply 1 Application topically 2 (two) times daily. (Patient not taking: Reported on 09/27/2024)   No facility-administered encounter medications on file as of 09/27/2024.   Hearing/Vision screen No results found. Immunizations and Health Maintenance Health Maintenance  Topic Date Due   COVID-19 Vaccine (12 - Moderna risk 2025-26 season) 02/24/2025   Medicare Annual Wellness (AWV)  09/27/2025  DTaP/Tdap/Td (3 - Td or Tdap) 06/09/2033   Pneumococcal Vaccine: 50+ Years  Completed   Influenza Vaccine  Completed   Bone Density Scan  Completed   Zoster Vaccines- Shingrix  Completed   Meningococcal B Vaccine  Aged Out        Assessment/Plan:  This is a routine wellness examination for [place].  Patient Care Team: Caro Harlene POUR, NP as PCP - General (Geriatric Medicine) [name], MD (Ophthalmology) Camelia Lukes, P[facility] (Dermatology)  I have personally reviewed and noted the following in the patient's chart:   Medical and social history Use of alcohol, tobacco or illicit drugs   Current medications and supplements including opioid prescriptions. Functional ability and status Nutritional status Physical activity Advanced directives List of other physicians Hospitalizations, surgeries, and ER visits in previous 12 months Vitals Screenings to include cognitive, depression, and falls Referrals and appointments  Orders Placed This Encounter  Procedures   Basic metabolic panel with GFR    This external order was created through the Results Console.   Comprehensive metabolic panel with GFR    This external order was created through the Results Console.   Do not attempt resuscitation (DNR)    If patient has no pulse and is not breathing:   Do Not Attempt Resuscitation    If patient has a pulse and/or is breathing: Medical Treatment Goals:   LIMITED ADDITIONAL INTERVENTIONS: Use medication/IV fluids and cardiac monitoring as indicated; Do not use intubation or mechanical ventilation (DNI), also provide comfort medications.  Transfer to Progressive/Stepdown as indicated, avoid Intensive Care.    Consent::   Discussion documented in EHR or advanced directives reviewed   In addition, I have reviewed and discussed with patient certain preventive protocols, quality metrics, and best practice recommendations. A written personalized care plan for preventive services as well as general preventive health recommendations were provided to patient.   Harlene POUR Caro, NP   09/27/2024

## 2024-09-27 NOTE — Patient Instructions (Signed)
 Ms. Kewley,  Thank you for taking the time for your Medicare Wellness Visit. I appreciate your continued commitment to your health goals. Please review the care plan we discussed, and feel free to reach out if I can assist you further.  Please note that Annual Wellness Visits do not include a physical exam. Some assessments may be limited, especially if the visit was conducted virtually. If needed, we may recommend an in-person follow-up with your provider.  Ongoing Care Seeing your primary care provider every 3 to 6 months helps us  monitor your health and provide consistent, personalized care.   Referrals If a referral was made during today's visit and you haven't received any updates within two weeks, please contact the referred provider directly to check on the status.  Recommended Screenings:  Health Maintenance  Topic Date Due   Medicare Annual Wellness Visit  09/03/2024   COVID-19 Vaccine (12 - Moderna risk 2025-26 season) 02/24/2025   DTaP/Tdap/Td vaccine (3 - Td or Tdap) 06/09/2033   Pneumococcal Vaccine for age over 1  Completed   Flu Shot  Completed   Osteoporosis screening with Bone Density Scan  Completed   Zoster (Shingles) Vaccine  Completed   Meningitis B Vaccine  Aged Out       09/27/2024    2:33 PM  Advanced Directives  Does Patient Have a Medical Advance Directive? Yes  Type of Advance Directive Healthcare Power of Attorney  Copy of Healthcare Power of Attorney in Chart? Yes - validated most recent copy scanned in chart (See row information)    Vision: Annual vision screenings are recommended for early detection of glaucoma, cataracts, and diabetic retinopathy. These exams can also reveal signs of chronic conditions such as diabetes and high blood pressure.  Dental: Annual dental screenings help detect early signs of oral cancer, gum disease, and other conditions linked to overall health, including heart disease and diabetes.

## 2024-10-13 LAB — CBC AND DIFFERENTIAL
HCT: 37 (ref 36–46)
Hemoglobin: 11.9 — AB (ref 12.0–16.0)
Neutrophils Absolute: 5384
Platelets: 279 K/uL (ref 150–400)
WBC: 8

## 2024-10-13 LAB — BASIC METABOLIC PANEL WITH GFR
BUN: 20 (ref 4–21)
CO2: 24 — AB (ref 13–22)
Chloride: 105 (ref 99–108)
Creatinine: 1 (ref 0.5–1.1)
Glucose: 100
Potassium: 4.2 meq/L (ref 3.5–5.1)
Sodium: 141 (ref 137–147)

## 2024-10-13 LAB — COMPREHENSIVE METABOLIC PANEL WITH GFR
Calcium: 8.8 (ref 8.7–10.7)
eGFR: 52

## 2024-10-13 LAB — CBC: RBC: 3.6 — AB (ref 3.87–5.11)

## 2024-10-14 ENCOUNTER — Encounter: Payer: Self-pay | Admitting: Orthopedic Surgery

## 2024-10-14 ENCOUNTER — Non-Acute Institutional Stay: Payer: Self-pay | Admitting: Orthopedic Surgery

## 2024-10-14 DIAGNOSIS — F02A4 Dementia in other diseases classified elsewhere, mild, with anxiety: Secondary | ICD-10-CM

## 2024-10-14 DIAGNOSIS — M810 Age-related osteoporosis without current pathological fracture: Secondary | ICD-10-CM

## 2024-10-14 DIAGNOSIS — R296 Repeated falls: Secondary | ICD-10-CM

## 2024-10-14 DIAGNOSIS — E559 Vitamin D deficiency, unspecified: Secondary | ICD-10-CM

## 2024-10-14 NOTE — Progress Notes (Unsigned)
 Location:  Other [facility]) Nursing Home Room Number: [facility] [id number] Place of Service:  ALF [phone]) Provider:  Gil Greig FORBES CARROLYN Caro Harlene MARLA, NP  Patient Care Team: Caro Harlene MARLA, NP as PCP - General (Geriatric Medicine) [name], MD (Ophthalmology) Camelia Lukes, [facility] (Dermatology)  Extended Emergency Contact Information Primary Emergency Contact: Devereus,Mary Mobile Phone: [phone] Relation: Daughter  Code Status:  DNR Goals of care: Advanced Directive information    10/14/2024   11:33 AM  Advanced Directives  Does Patient Have a Medical Advance Directive? Yes  Type of Advance Directive Healthcare Power of Attorney  Does patient want to make changes to medical advance directive? No - Patient declined  Copy of Healthcare Power of Attorney in Chart? Yes - validated most recent copy scanned in chart (See row information)     Chief Complaint  Patient presents with   Anxiety    HPI:  Pt is a 89 y.o. female seen today for an acute visit for    History reviewed. No pertinent past medical history. Past Surgical History:  Procedure Laterality Date   BREAST BIOPSY Right 1963   negative    Allergies[1]  Outpatient Encounter Medications as of 10/14/2024  Medication Sig   acetaminophen (TYLENOL) 500 MG tablet Take 500 mg by mouth every 6 (six) hours as needed.   aluminum-magnesium hydroxide 200-200 MG/5ML suspension Take 15 mLs by mouth every 4 (four) hours as needed for indigestion.   Biotin 5000 MCG CAPS Take 1 capsule by mouth daily.   bismuth subsalicylate (PEPTO BISMOL) 262 MG/15ML suspension Take 30 mLs by mouth as needed.   Calcium Carb-Cholecalciferol (CALTRATE 600+D3 SOFT) 600-20 MG-MCG CHEW Chew 1 capsule by mouth daily.   carbamide peroxide (DEBROX) 6.5 % OTIC solution 5 drops as needed.   cetirizine (ZYRTEC) 10 MG tablet Take 10 mg by mouth daily. Give 1/2 tablet by mouth as needed at bedtime.   Cholecalciferol (VITAMIN D3)  50 MCG (2000 UT) capsule Take 2,000 Units by mouth daily.   cyanocobalamin (VITAMIN B12) 1000 MCG tablet Take 1,000 mcg by mouth daily.   dextromethorphan-guaiFENesin (ROBITUSSIN-DM) 10-100 MG/5ML liquid Take 10 mLs by mouth every 4 (four) hours as needed for cough.   ezetimibe  (ZETIA ) 10 MG tablet Take 1 tablet (10 mg total) by mouth daily.   furosemide (LASIX) 20 MG tablet Take 20 mg by mouth as needed.   Glucose 15 GM/32ML GEL Take 1 packet by mouth as needed.   loratadine (CLARITIN) 10 MG tablet Take 10 mg by mouth daily as needed for allergies.   magnesium hydroxide (MILK OF MAGNESIA) 400 MG/5ML suspension Take 30 mLs by mouth daily as needed for mild constipation.   memantine (NAMENDA) 10 MG tablet Take 10 mg by mouth 2 (two) times daily.   metroNIDAZOLE (METROCREAM) 0.75 % cream Apply 1 Application topically 2 (two) times daily.   nystatin (MYCOSTATIN/NYSTOP) powder Apply 1 Application topically 2 (two) times daily.   ondansetron  (ZOFRAN ) 4 MG tablet Take 4 mg by mouth every 8 (eight) hours as needed for nausea or vomiting.   OXYGEN Inhale into the lungs. 2lpm   risedronate  (ACTONEL ) 150 MG tablet Take 1 tablet (150 mg total) by mouth every 30 (thirty) days. WITH WATER ON EMPTY STOMACH. NOTHING BY MOUTH OR LIE DOWN FOR NEXT 30 MINUTES AS DIRECTED   triamcinolone  cream (KENALOG ) 0.1 % Apply 1 Application topically 2 (two) times daily.   No facility-administered encounter medications on file as of 10/14/2024.    Review  of Systems  Immunization History  Administered Date(s) Administered   Influenza-Unspecified 08/24/2022, 08/27/2023, 08/16/2024   Moderna Covid-19 Fall Seasonal Vaccine 44yrs & older 02/10/2023   Moderna Covid-19 Vaccine Bivalent Booster 48yrs & up 07/25/2021, 04/01/2022, 09/12/2022   Moderna Sars-Covid-2 Vaccination 11/18/2019, 12/16/2019, 09/18/2020, 03/21/2021, 07/31/2023   Pneumococcal Conjugate-13 01/12/2014   Pneumococcal Polysaccharide-23 10/03/1999, 11/05/1999    Tdap 07/04/2010, 06/10/2023   Unspecified SARS-COV-2 Vaccination 02/12/2024, 08/26/2024   Zoster Recombinant(Shingrix) 02/24/2008, 06/10/2023, 09/10/2023   Zoster, Live 02/24/2008   Pertinent  Health Maintenance Due  Topic Date Due   Influenza Vaccine  Completed   Bone Density Scan  Completed      09/04/2023    2:23 PM 06/15/2024    1:08 PM 06/30/2024   10:22 AM 09/13/2024    1:13 PM 09/27/2024    2:33 PM  Fall Risk  Falls in the past year? 0 0 0 1 0  Was there an injury with Fall? 0  0  0  0  0   Fall Risk Category Calculator 0 0 0 1 0  Patient at Risk for Falls Due to  No Fall Risks Impaired balance/gait;No Fall Risks History of fall(s) Impaired balance/gait;Impaired mobility  Fall risk Follow up  Falls evaluation completed Falls evaluation completed Falls evaluation completed Falls evaluation completed     Data saved with a previous flowsheet row definition   Functional Status Survey:    Vitals:   10/14/24 1131 10/14/24 1132  BP: (!) 146/73 (!) 153/52  Resp: 18   Temp: 98.3 F (36.8 C)   SpO2: 94%   Weight: 138 lb 3.2 oz (62.7 kg)   Height: 5' 4 (1.626 m)    Body mass index is 23.72 kg/m. Physical Exam  Labs reviewed: Recent Labs    01/18/24 0914 06/09/24 0810 08/12/24 1210 08/15/24 0000 09/15/24 0000 10/13/24 0000  NA 142 141 136 142 141 141  K 3.6 3.6 4.1 4.4 4.3 4.2  CL 107 104 100 106 101 105  CO2 23 29 25  32* 30* 24*  GLUCOSE 145* 93 95  --   --   --   BUN 18 22 24* 19 28* 20  CREATININE 0.87 0.90 0.76 0.9 0.9 1.0  CALCIUM 8.9 9.3 9.0 9.0 9.7 8.8   Recent Labs    01/18/24 0914 06/09/24 0810  AST 28 22  ALT 15 9  ALKPHOS 42  --   BILITOT 0.5 0.6  PROT 7.1 7.1  ALBUMIN 4.0  --    Recent Labs    01/18/24 0914 06/09/24 0810 08/12/24 1210 10/13/24 0000  WBC 5.5 7.2 7.0 8.0  NEUTROABS 3.4 2,066  --  5,384.00  HGB 13.3 13.0 11.1* 11.9*  HCT 40.7 39.6 34.5* 37  MCV 106.3* 104.8* 105.2*  --   PLT 209 216 246 279   Lab Results   Component Value Date   TSH 2.95 08/15/2024   Lab Results  Component Value Date   HGBA1C 5.9 (H) 08/06/2023   Lab Results  Component Value Date   CHOL 143 08/06/2023   HDL 56 08/06/2023   LDLCALC 72 08/06/2023   TRIG 69 08/06/2023   CHOLHDL 2.6 08/06/2023    Significant Diagnostic Results in last 30 days:  No results found.  Assessment/Plan There are no diagnoses linked to this encounter.   Family/ staff Communication: ***  Labs/tests ordered:  ***     [1]  Allergies Allergen Reactions   Bee Pollen    Molds & Smuts

## 2024-10-17 MED ORDER — SERTRALINE HCL 25 MG PO TABS: 25.0000 mg | ORAL_TABLET | Freq: Every day | ORAL | Status: AC

## 2024-11-09 ENCOUNTER — Telehealth: Payer: Self-pay | Admitting: Nurse Practitioner

## 2024-11-09 NOTE — Telephone Encounter (Signed)
 [facility] [place] [phone] called to inform provider that patient's speech evaluation is scheduled for 11/10/2024 at 11:00 am. Order was received 10/14/24.

## 2024-11-17 LAB — COMPREHENSIVE METABOLIC PANEL WITH GFR
Calcium: 8.8 (ref 8.7–10.7)
eGFR: 58

## 2024-11-17 LAB — BASIC METABOLIC PANEL WITH GFR
BUN: 33 — AB (ref 4–21)
CO2: 28 — AB (ref 13–22)
Chloride: 108 (ref 99–108)
Creatinine: 0.9 (ref 0.5–1.1)
Glucose: 85
Potassium: 4.5 meq/L (ref 3.5–5.1)
Sodium: 142 (ref 137–147)

## 2024-12-01 ENCOUNTER — Non-Acute Institutional Stay: Payer: Self-pay | Admitting: Nurse Practitioner

## 2024-12-01 ENCOUNTER — Encounter: Payer: Self-pay | Admitting: Nurse Practitioner

## 2024-12-01 DIAGNOSIS — I872 Venous insufficiency (chronic) (peripheral): Secondary | ICD-10-CM

## 2024-12-01 DIAGNOSIS — F02A4 Dementia in other diseases classified elsewhere, mild, with anxiety: Secondary | ICD-10-CM | POA: Diagnosis not present

## 2024-12-01 DIAGNOSIS — E538 Deficiency of other specified B group vitamins: Secondary | ICD-10-CM

## 2024-12-01 DIAGNOSIS — G301 Alzheimer's disease with late onset: Secondary | ICD-10-CM

## 2024-12-01 NOTE — Progress Notes (Signed)
 " Location:  Other Nursing Home Room Number: [name] Place of Service:  ALF (13)  Cruz Shelby POUR, NP  Patient Care Team: Cruz Shelby POUR, NP as PCP - General (Geriatric Medicine) Shelby Standing, MD (Ophthalmology) Camelia Lukes, [facility] (Dermatology)  Extended Emergency Contact Information Primary Emergency Contact: Devereus,Mary Mobile Phone: [phone] Relation: Daughter  Goals of care: Advanced Directive information    10/14/2024   11:33 AM  Advanced Directives  Does Patient Have a Medical Advance Directive? Yes  Type of Advance Directive Healthcare Power of Attorney  Does patient want to make changes to medical advance directive? No - Patient declined  Copy of Healthcare Power of Attorney in Chart? Yes - validated most recent copy scanned in chart (See row information)     Chief Complaint  Patient presents with   Increased Confusion and Behaviors    Increased Confusion and Behaviors.     HPI:  Pt is a 89 y.o. female seen today for an acute visit for increased confusion and anxiety per staff.  Reports that yesterday and this morning she has had increase is restlessness and having a hard time doing her usual routine.  She was not able to concentrate at dinner last night and they had to take her out of the environment to get her to eat.  She shows signs of anxiety when there is a different routine or new actiivity  She has LE edema bilaterally and staff reports this is somewhat worse than baseline. She has been taking her PRN lasix pretty much daily.  Denies urinary pain, urgency, frequency, no constipation.  No shortness of breath or cough or congestion.  No fevers or chills.  No LE pain.     History reviewed. No pertinent past medical history. Past Surgical History:  Procedure Laterality Date   BREAST BIOPSY Right 1963   negative    Allergies[1]  Outpatient Encounter Medications as of 12/01/2024  Medication Sig   acetaminophen  (TYLENOL) 500 MG tablet Take 500 mg by mouth every 6 (six) hours as needed.   aluminum-magnesium hydroxide 200-200 MG/5ML suspension Take 15 mLs by mouth every 4 (four) hours as needed for indigestion.   Biotin 5000 MCG CAPS Take 1 capsule by mouth daily.   bismuth subsalicylate (PEPTO BISMOL) 262 MG/15ML suspension Take 30 mLs by mouth as needed.   Calcium Carb-Cholecalciferol (CALTRATE 600+D3 SOFT) 600-20 MG-MCG CHEW Chew 1 capsule by mouth daily.   carbamide peroxide (DEBROX) 6.5 % OTIC solution 5 drops as needed.   cetirizine (ZYRTEC) 10 MG tablet Take 10 mg by mouth daily. Give 1/2 tablet by mouth as needed at bedtime.   Cholecalciferol (VITAMIN D3) 50 MCG (2000 UT) capsule Take 2,000 Units by mouth daily.   cyanocobalamin  (VITAMIN B12) 1000 MCG tablet Take 1,000 mcg by mouth daily.   dextromethorphan-guaiFENesin (ROBITUSSIN-DM) 10-100 MG/5ML liquid Take 10 mLs by mouth every 4 (four) hours as needed for cough.   ezetimibe  (ZETIA ) 10 MG tablet Take 1 tablet (10 mg total) by mouth daily.   furosemide (LASIX) 20 MG tablet Take 20 mg by mouth as needed.   Glucose 15 GM/32ML GEL Take 1 packet by mouth as needed.   hydrOXYzine (ATARAX) 10 MG tablet Take 10 mg by mouth daily. Take one tablet by mouth daily as needed.   loratadine (CLARITIN) 10 MG tablet Take 10 mg by mouth daily as needed for allergies.   magnesium hydroxide (MILK OF MAGNESIA) 400 MG/5ML suspension Take 30 mLs by mouth daily as needed  for mild constipation.   memantine (NAMENDA) 10 MG tablet Take 10 mg by mouth 2 (two) times daily.   metroNIDAZOLE (METROCREAM) 0.75 % cream Apply 1 Application topically 2 (two) times daily.   nystatin (MYCOSTATIN/NYSTOP) powder Apply 1 Application topically 2 (two) times daily.   ondansetron  (ZOFRAN ) 4 MG tablet Take 4 mg by mouth every 8 (eight) hours as needed for nausea or vomiting.   OXYGEN Inhale into the lungs. 2lpm   risedronate  (ACTONEL ) 150 MG tablet Take 1 tablet (150 mg total) by mouth  every 30 (thirty) days. WITH WATER ON EMPTY STOMACH. NOTHING BY MOUTH OR LIE DOWN FOR NEXT 30 MINUTES AS DIRECTED   sertraline  (ZOLOFT ) 25 MG tablet Take 1 tablet (25 mg total) by mouth daily.   triamcinolone  cream (KENALOG ) 0.1 % Apply 1 Application topically 2 (two) times daily.   No facility-administered encounter medications on file as of 12/01/2024.    Review of Systems  Constitutional:  Negative for activity change, appetite change, fatigue and unexpected weight change.  HENT:  Negative for congestion and hearing loss.   Eyes: Negative.   Respiratory:  Negative for cough and shortness of breath.   Cardiovascular:  Positive for leg swelling. Negative for chest pain and palpitations.  Gastrointestinal:  Negative for abdominal pain, constipation and diarrhea.  Genitourinary:  Negative for difficulty urinating and dysuria.  Musculoskeletal:  Negative for arthralgias and myalgias.  Skin:  Negative for color change and wound.  Neurological:  Negative for dizziness and weakness.  Psychiatric/Behavioral:  Positive for confusion and decreased concentration. Negative for agitation and behavioral problems. The patient is nervous/anxious.     Immunization History  Administered Date(s) Administered   Influenza-Unspecified 08/24/2022, 08/27/2023, 08/16/2024   Moderna Covid-19 Fall Seasonal Vaccine 6yrs & older 02/10/2023   Moderna Covid-19 Vaccine Bivalent Booster 3yrs & up 07/25/2021, 04/01/2022, 09/12/2022   Moderna Sars-Covid-2 Vaccination 11/18/2019, 12/16/2019, 09/18/2020, 03/21/2021, 07/31/2023   Pneumococcal Conjugate-13 01/12/2014   Pneumococcal Polysaccharide-23 10/03/1999, 11/05/1999   Tdap 07/04/2010, 06/10/2023   Unspecified SARS-COV-2 Vaccination 02/12/2024, 08/26/2024   Zoster Recombinant(Shingrix) 02/24/2008, 06/10/2023, 09/10/2023   Zoster, Live 02/24/2008   Pertinent  Health Maintenance Due  Topic Date Due   Influenza Vaccine  Completed   Bone Density Scan  Completed       09/04/2023    2:23 PM 06/15/2024    1:08 PM 06/30/2024   10:22 AM 09/13/2024    1:13 PM 09/27/2024    2:33 PM  Fall Risk  Falls in the past year? 0 0 0 1 0  Was there an injury with Fall? 0  0  0  0  0   Fall Risk Category Calculator 0 0 0 1 0  Patient at Risk for Falls Due to  No Fall Risks Impaired balance/gait;No Fall Risks History of fall(s) Impaired balance/gait;Impaired mobility  Fall risk Follow up  Falls evaluation completed Falls evaluation completed Falls evaluation completed Falls evaluation completed     Data saved with a previous flowsheet row definition   Functional Status Survey:    Vitals:   12/01/24 1422  BP: (!) 108/56  Pulse: 77  Resp: 17  Temp: (!) 97 F (36.1 C)  SpO2: 93%  Weight: 137 lb (62.1 kg)  Height: 5' 4 (1.626 m)   Body mass index is 23.52 kg/m. Physical Exam Constitutional:      General: She is not in acute distress.    Appearance: She is well-developed. She is not diaphoretic.  HENT:     Head:  Normocephalic and atraumatic.     Mouth/Throat:     Pharynx: No oropharyngeal exudate.  Eyes:     Conjunctiva/sclera: Conjunctivae normal.     Pupils: Pupils are equal, round, and reactive to light.  Cardiovascular:     Rate and Rhythm: Normal rate and regular rhythm.     Heart sounds: Normal heart sounds.  Pulmonary:     Effort: Pulmonary effort is normal.     Breath sounds: Normal breath sounds.  Abdominal:     General: Bowel sounds are normal.     Palpations: Abdomen is soft.  Musculoskeletal:     Cervical back: Normal range of motion and neck supple.     Right lower leg: Edema present.     Left lower leg: Edema present.  Skin:    General: Skin is warm and dry.  Neurological:     Mental Status: She is alert. Mental status is at baseline.  Psychiatric:        Mood and Affect: Mood normal.     Labs reviewed: Recent Labs    01/18/24 0914 06/09/24 0810 08/12/24 1210 08/15/24 0000 09/15/24 0000 10/13/24 0000  NA 142 141  136 142 141 141  K 3.6 3.6 4.1 4.4 4.3 4.2  CL 107 104 100 106 101 105  CO2 23 29 25  32* 30* 24*  GLUCOSE 145* 93 95  --   --   --   BUN 18 22 24* 19 28* 20  CREATININE 0.87 0.90 0.76 0.9 0.9 1.0  CALCIUM 8.9 9.3 9.0 9.0 9.7 8.8   Recent Labs    01/18/24 0914 06/09/24 0810  AST 28 22  ALT 15 9  ALKPHOS 42  --   BILITOT 0.5 0.6  PROT 7.1 7.1  ALBUMIN 4.0  --    Recent Labs    01/18/24 0914 06/09/24 0810 08/12/24 1210 10/13/24 0000  WBC 5.5 7.2 7.0 8.0  NEUTROABS 3.4 2,066  --  5,384.00  HGB 13.3 13.0 11.1* 11.9*  HCT 40.7 39.6 34.5* 37  MCV 106.3* 104.8* 105.2*  --   PLT 209 216 246 279   Lab Results  Component Value Date   TSH 2.95 08/15/2024   Lab Results  Component Value Date   HGBA1C 5.9 (H) 08/06/2023   Lab Results  Component Value Date   CHOL 143 08/06/2023   HDL 56 08/06/2023   LDLCALC 72 08/06/2023   TRIG 69 08/06/2023   CHOLHDL 2.6 08/06/2023    Significant Diagnostic Results in last 30 days:  No results found.  Assessment/Plan 1. Vitamin B12 deficiency (Primary) B12 was 196 back in Oct 2025- B12 supplement added- will follow up b12 level and cbc to monitor anemia  2. 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   3. 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       Shelby Cruz BODILY [facility] [phone]      [1]  Allergies Allergen Reactions   Bee Pollen    Molds & Smuts    "

## 2024-12-01 NOTE — Progress Notes (Deleted)
 " Location:  Other [place].  Nursing Home Room Number: [name] Place of Service:  ALF (13)  Caro Harlene POUR, NP  Patient Care Team: Caro Harlene POUR, NP as PCP - General (Geriatric Medicine) [name], MD (Ophthalmology) Camelia Lukes, [facility] (Dermatology)  Extended Emergency Contact Information Primary Emergency Contact: Devereus,Mary Mobile Phone: [phone] Relation: Daughter  Goals of care: Advanced Directive information    10/14/2024   11:33 AM  Advanced Directives  Does Patient Have a Medical Advance Directive? Yes  Type of Advance Directive Healthcare Power of Attorney  Does patient want to make changes to medical advance directive? No - Patient declined  Copy of Healthcare Power of Attorney in Chart? Yes - validated most recent copy scanned in chart (See row information)     Chief Complaint  Patient presents with   Increased Confusion and Behaviors    Increased Confusion and Behaviors.     HPI:  Pt is a 89 y.o. female seen today for an acute visit for Increased Confusion and Behaviors.    History reviewed. No pertinent past medical history. Past Surgical History:  Procedure Laterality Date   BREAST BIOPSY Right 1963   negative    Allergies[1]  Outpatient Encounter Medications as of 12/01/2024  Medication Sig   acetaminophen (TYLENOL) 500 MG tablet Take 500 mg by mouth every 6 (six) hours as needed.   aluminum-magnesium hydroxide 200-200 MG/5ML suspension Take 15 mLs by mouth every 4 (four) hours as needed for indigestion.   Biotin 5000 MCG CAPS Take 1 capsule by mouth daily.   bismuth subsalicylate (PEPTO BISMOL) 262 MG/15ML suspension Take 30 mLs by mouth as needed.   Calcium Carb-Cholecalciferol (CALTRATE 600+D3 SOFT) 600-20 MG-MCG CHEW Chew 1 capsule by mouth daily.   carbamide peroxide (DEBROX) 6.5 % OTIC solution 5 drops as needed.   cetirizine (ZYRTEC) 10 MG tablet Take 10 mg by mouth daily. Give 1/2 tablet by mouth  as needed at bedtime.   Cholecalciferol (VITAMIN D3) 50 MCG (2000 UT) capsule Take 2,000 Units by mouth daily.   cyanocobalamin  (VITAMIN B12) 1000 MCG tablet Take 1,000 mcg by mouth daily.   dextromethorphan-guaiFENesin (ROBITUSSIN-DM) 10-100 MG/5ML liquid Take 10 mLs by mouth every 4 (four) hours as needed for cough.   ezetimibe  (ZETIA ) 10 MG tablet Take 1 tablet (10 mg total) by mouth daily.   furosemide (LASIX) 20 MG tablet Take 20 mg by mouth as needed.   Glucose 15 GM/32ML GEL Take 1 packet by mouth as needed.   hydrOXYzine (ATARAX) 10 MG tablet Take 10 mg by mouth daily. Take one tablet by mouth daily as needed.   loratadine (CLARITIN) 10 MG tablet Take 10 mg by mouth daily as needed for allergies.   magnesium hydroxide (MILK OF MAGNESIA) 400 MG/5ML suspension Take 30 mLs by mouth daily as needed for mild constipation.   memantine (NAMENDA) 10 MG tablet Take 10 mg by mouth 2 (two) times daily.   metroNIDAZOLE (METROCREAM) 0.75 % cream Apply 1 Application topically 2 (two) times daily.   nystatin (MYCOSTATIN/NYSTOP) powder Apply 1 Application topically 2 (two) times daily.   ondansetron  (ZOFRAN ) 4 MG tablet Take 4 mg by mouth every 8 (eight) hours as needed for nausea or vomiting.   OXYGEN Inhale into the lungs. 2lpm   risedronate  (ACTONEL ) 150 MG tablet Take 1 tablet (150 mg total) by mouth every 30 (thirty) days. WITH WATER ON EMPTY STOMACH. NOTHING BY MOUTH OR LIE DOWN FOR NEXT 30 MINUTES AS DIRECTED  sertraline  (ZOLOFT ) 25 MG tablet Take 1 tablet (25 mg total) by mouth daily.   triamcinolone  cream (KENALOG ) 0.1 % Apply 1 Application topically 2 (two) times daily.   No facility-administered encounter medications on file as of 12/01/2024.    Review of Systems***  Immunization History  Administered Date(s) Administered   Influenza-Unspecified 08/24/2022, 08/27/2023, 08/16/2024   Moderna Covid-19 Fall Seasonal Vaccine 48yrs & older 02/10/2023   Moderna Covid-19 Vaccine Bivalent  Booster 74yrs & up 07/25/2021, 04/01/2022, 09/12/2022   Moderna Sars-Covid-2 Vaccination 11/18/2019, 12/16/2019, 09/18/2020, 03/21/2021, 07/31/2023   Pneumococcal Conjugate-13 01/12/2014   Pneumococcal Polysaccharide-23 10/03/1999, 11/05/1999   Tdap 07/04/2010, 06/10/2023   Unspecified SARS-COV-2 Vaccination 02/12/2024, 08/26/2024   Zoster Recombinant(Shingrix) 02/24/2008, 06/10/2023, 09/10/2023   Zoster, Live 02/24/2008   Pertinent  Health Maintenance Due  Topic Date Due   Influenza Vaccine  Completed   Bone Density Scan  Completed      09/04/2023    2:23 PM 06/15/2024    1:08 PM 06/30/2024   10:22 AM 09/13/2024    1:13 PM 09/27/2024    2:33 PM  Fall Risk  Falls in the past year? 0 0 0 1 0  Was there an injury with Fall? 0  0  0  0  0   Fall Risk Category Calculator 0 0 0 1 0  Patient at Risk for Falls Due to  No Fall Risks Impaired balance/gait;No Fall Risks History of fall(s) Impaired balance/gait;Impaired mobility  Fall risk Follow up  Falls evaluation completed Falls evaluation completed Falls evaluation completed Falls evaluation completed     Data saved with a previous flowsheet row definition   Functional Status Survey:    Vitals:   12/01/24 1422  BP: (!) 108/56  Pulse: 77  Resp: 17  Temp: (!) 97 F (36.1 C)  SpO2: 93%  Weight: 137 lb (62.1 kg)  Height: 5' 4 (1.626 m)   Body mass index is 23.52 kg/m. Physical Exam***  Labs reviewed: Recent Labs    01/18/24 0914 06/09/24 0810 08/12/24 1210 08/15/24 0000 09/15/24 0000 10/13/24 0000 11/17/24 0000  NA 142 141 136   < > 141 141 142  K 3.6 3.6 4.1   < > 4.3 4.2 4.5  CL 107 104 100   < > 101 105 108  CO2 23 29 25    < > 30* 24* 28*  GLUCOSE 145* 93 95  --   --   --   --   BUN 18 22 24*   < > 28* 20 33*  CREATININE 0.87 0.90 0.76   < > 0.9 1.0 0.9  CALCIUM 8.9 9.3 9.0   < > 9.7 8.8 8.8   < > = values in this interval not displayed.   Recent Labs    01/18/24 0914 06/09/24 0810  AST 28 22  ALT 15 9   ALKPHOS 42  --   BILITOT 0.5 0.6  PROT 7.1 7.1  ALBUMIN 4.0  --    Recent Labs    01/18/24 0914 06/09/24 0810 08/12/24 1210 10/13/24 0000  WBC 5.5 7.2 7.0 8.0  NEUTROABS 3.4 2,066  --  5,384.00  HGB 13.3 13.0 11.1* 11.9*  HCT 40.7 39.6 34.5* 37  MCV 106.3* 104.8* 105.2*  --   PLT 209 216 246 279   Lab Results  Component Value Date   TSH 2.95 08/15/2024   Lab Results  Component Value Date   HGBA1C 5.9 (H) 08/06/2023   Lab Results  Component Value Date  CHOL 143 08/06/2023   HDL 56 08/06/2023   LDLCALC 72 08/06/2023   TRIG 69 08/06/2023   CHOLHDL 2.6 08/06/2023    Significant Diagnostic Results in last 30 days:  No results found.  Assessment/Plan No problem-specific Assessment & Plan notes found for this encounter.    Shelby Cruz K. Caro BODILY [facility] [phone]       [1]  Allergies Allergen Reactions   Bee Pollen    Molds & Smuts    "

## 2024-12-21 ENCOUNTER — Encounter: Payer: Self-pay | Admitting: Student
